# Patient Record
Sex: Female | Born: 1995 | Race: White | Hispanic: No | Marital: Married | Smoking: Former smoker
Health system: Southern US, Community
[De-identification: ages and names within clinical notes are randomized; demographics above are authoritative.]

## PROBLEM LIST (undated history)

## (undated) DIAGNOSIS — K3184 Gastroparesis: Secondary | ICD-10-CM

## (undated) HISTORY — PX: CHOLECYSTECTOMY: SHX55

## (undated) HISTORY — PX: GASTROSTOMY TUBE PLACEMENT: SHX655

---
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
## 2015-03-03 ENCOUNTER — Emergency Department (HOSPITAL_BASED_OUTPATIENT_CLINIC_OR_DEPARTMENT_OTHER): Payer: Medicaid Other

## 2015-03-03 ENCOUNTER — Encounter (HOSPITAL_BASED_OUTPATIENT_CLINIC_OR_DEPARTMENT_OTHER): Payer: Self-pay | Admitting: *Deleted

## 2015-03-03 ENCOUNTER — Emergency Department (HOSPITAL_BASED_OUTPATIENT_CLINIC_OR_DEPARTMENT_OTHER)
Admission: EM | Admit: 2015-03-03 | Discharge: 2015-03-03 | Disposition: A | Discharge status: Home / Self Care | Payer: Medicaid Other | Attending: Emergency Medicine | Admitting: Emergency Medicine

## 2015-03-03 DIAGNOSIS — J069 Acute upper respiratory infection, unspecified: Secondary | ICD-10-CM | POA: Diagnosis not present

## 2015-03-03 DIAGNOSIS — R000 Tachycardia, unspecified: Secondary | ICD-10-CM | POA: Diagnosis not present

## 2015-03-03 DIAGNOSIS — E669 Obesity, unspecified: Secondary | ICD-10-CM | POA: Diagnosis not present

## 2015-03-03 DIAGNOSIS — R05 Cough: Secondary | ICD-10-CM | POA: Diagnosis present

## 2015-03-03 LAB — CBC WITH DIFFERENTIAL/PLATELET
BASOS ABS: 0 10*3/uL (ref 0.0–0.1)
BASOS PCT: 0 %
EOS ABS: 0.1 10*3/uL (ref 0.0–0.7)
EOS PCT: 1 %
HCT: 42.9 % (ref 36.0–46.0)
Hemoglobin: 13.9 g/dL (ref 12.0–15.0)
Lymphocytes Relative: 13 %
Lymphs Abs: 1.1 10*3/uL (ref 0.7–4.0)
MCH: 29.1 pg (ref 26.0–34.0)
MCHC: 32.4 g/dL (ref 30.0–36.0)
MCV: 89.9 fL (ref 78.0–100.0)
MONO ABS: 0.5 10*3/uL (ref 0.1–1.0)
Monocytes Relative: 6 %
Neutro Abs: 6.6 10*3/uL (ref 1.7–7.7)
Neutrophils Relative %: 80 %
PLATELETS: 215 10*3/uL (ref 150–400)
RBC: 4.77 MIL/uL (ref 3.87–5.11)
RDW: 13 % (ref 11.5–15.5)
WBC: 8.3 10*3/uL (ref 4.0–10.5)

## 2015-03-03 LAB — COMPREHENSIVE METABOLIC PANEL
ALBUMIN: 3.9 g/dL (ref 3.5–5.0)
ALK PHOS: 83 U/L (ref 38–126)
ALT: 44 U/L (ref 14–54)
ANION GAP: 6 (ref 5–15)
AST: 32 U/L (ref 15–41)
BILIRUBIN TOTAL: 0.9 mg/dL (ref 0.3–1.2)
BUN: 12 mg/dL (ref 6–20)
CALCIUM: 9 mg/dL (ref 8.9–10.3)
CO2: 26 mmol/L (ref 22–32)
Chloride: 107 mmol/L (ref 101–111)
Creatinine, Ser: 0.94 mg/dL (ref 0.44–1.00)
GFR calc Af Amer: >60 mL/min (ref >60)
GLUCOSE: 122 mg/dL — AB (ref 65–99)
Potassium: 3.6 mmol/L (ref 3.5–5.1)
Sodium: 139 mmol/L (ref 135–145)
TOTAL PROTEIN: 7.1 g/dL (ref 6.5–8.1)

## 2015-03-03 MED ORDER — ACETAMINOPHEN 325 MG PO TABS: 650.0000 mg | ORAL_TABLET | Freq: Once | ORAL | Status: DC

## 2015-03-03 MED ORDER — SODIUM CHLORIDE 0.9 % IV BOLUS (SEPSIS)
2000.0000 mL | Freq: Once | INTRAVENOUS | Status: AC
  Administered 2015-03-03: 1000 mL via INTRAVENOUS

## 2015-03-03 NOTE — ED Provider Notes (Signed)
CSN: [id number]     Arrival date & time 03/03/15  0901 History   First MD Initiated Contact with Patient 03/03/15 [phone]     Chief Complaint  Patient presents with  . URI     (Consider location/radiation/quality/duration/timing/severity/associated sxs/prior Treatment) HPI  19 year old female who is about 5 months pregnant presents with cough, congestion, sore throat, and chest congestion for the past 1 week. Has been having on and off fevers, last night her temperature was 101. Denies shortness of breath. Occasionally chest pain with coughing. Has been having muscle aches in her neck but no stiffness. No diffuse body aches. Went to an urgent care last week and was told to take TheraFlu. Has been taking that with no relief at all of her symptoms seem to be worsening. No abdominal pain. Vomited once last week but otherwise no vomiting or diarrhea. No urinary symptoms.  Past Medical History  Diagnosis Date  . Gastroparesis    Past Surgical History  Procedure Laterality Date  . Gastrostomy tube placement      has been removed  . Cholecystectomy     History reviewed. No pertinent family history. Social History  Substance Use Topics  . Smoking status: Never Smoker   . Smokeless tobacco: None  . Alcohol Use: No   OB History    Gravida Para Term Preterm AB TAB SAB Ectopic Multiple Living   1              Review of Systems  Constitutional: Positive for fever.  HENT: Positive for congestion and sore throat.   Respiratory: Positive for cough. Negative for shortness of breath.   Gastrointestinal: Negative for abdominal pain.  Genitourinary: Negative for dysuria.  All other systems reviewed and are negative.     Allergies  Hydrocodone-acetaminophen and Sulfa antibiotics  Home Medications   Prior to Admission medications   Not on File   BP 133/105 mmHg  Pulse 142  Temp(Src) 97.6 F (36.4 C) (Oral)  Resp 20  Ht 5\' 8"  (1.727 m)  Wt 268 lb (121.564 kg)  BMI 40.76 kg/m2   SpO2 100% Physical Exam  Constitutional: She is oriented to person, place, and time. She appears well-developed and well-nourished.  obese  HENT:  Head: Normocephalic and atraumatic.  Right Ear: External ear normal.  Left Ear: External ear normal.  Nose: Nose normal.  Mouth/Throat: Uvula is midline. No oropharyngeal exudate.  Nasal congestion  Eyes: Right eye exhibits no discharge. Left eye exhibits no discharge.  Cardiovascular: Regular rhythm and normal heart sounds.  Tachycardia present.   HR in 120s  Pulmonary/Chest: Effort normal and breath sounds normal. She has no rales.  Abdominal: Soft. There is no tenderness.  Lymphadenopathy:    She has no cervical adenopathy.  Neurological: She is alert and oriented to person, place, and time.  Skin: Skin is warm and dry.  Nursing note and vitals reviewed.   ED Course  Procedures (including critical care time) Labs Review Labs Reviewed  COMPREHENSIVE METABOLIC PANEL - Abnormal; Notable for the following:    Glucose, Bld 122 (*)    All other components within normal limits  CBC WITH DIFFERENTIAL/PLATELET    Imaging Review Dg Chest 2 View  03/03/2015  CLINICAL DATA:  Productive cough and fever. EXAM: CHEST  2 VIEW COMPARISON:  09/28/2014 FINDINGS: The heart size and mediastinal contours are within normal limits. Both lungs are clear. The visualized skeletal structures are unremarkable. IMPRESSION: Normal chest. Electronically Signed   By: Francene BoyersJames  Maxwell  M.D.   On: 03/03/2015 10:03   I have personally reviewed and evaluated these images and lab results as part of my medical decision-making.   EKG Interpretation   Date/Time:  Saturday March 03 2015 09:12:30 EST Ventricular Rate:  141 PR Interval:  104 QRS Duration: 82 QT Interval:  366 QTC Calculation: 560 R Axis:   98 Text Interpretation:  Sinus tachycardia with short PR Rightward axis  Nonspecific ST and T wave abnormality Abnormal ECG No old tracing to  compare  Confirmed by Yasira Engelson  MD, Derrion Tritz ([id number]) on 03/03/2015 9:14:08 AM      MDM   Final diagnoses:  Upper respiratory infection    Patient's tachycardia is at least partially due to the TheraFlu she took just prior to arrival. Discussed x-ray imaging in the setting of pregnancy and patient agrees. X-ray shows no pneumonia. Her sore throat is likely a part of her overall illness, very low suspicion that this is strep pharyngitis, especially given her Centor score is 0. Heart rate is now normal after IV fluids. This appears to be a viral upper respiratory infection. No indication for antibiotics. Discussed treatment options given her current pregnancy and recommend consultation with pharmacist for further options    Pricilla Loveless, MD 03/03/15 1627

## 2015-03-03 NOTE — ED Notes (Signed)
Pt reports URI x 1 week. Pt took theraflu this morning.  Pt seen and treated by PCP last week.

## 2016-02-12 ENCOUNTER — Encounter (HOSPITAL_BASED_OUTPATIENT_CLINIC_OR_DEPARTMENT_OTHER): Payer: Self-pay

## 2016-02-12 ENCOUNTER — Emergency Department (HOSPITAL_BASED_OUTPATIENT_CLINIC_OR_DEPARTMENT_OTHER)
Admission: EM | Admit: 2016-02-12 | Discharge: 2016-02-12 | Disposition: A | Discharge status: Home / Self Care | Payer: Medicaid Other | Attending: Dermatology | Admitting: Dermatology

## 2016-02-12 DIAGNOSIS — F17200 Nicotine dependence, unspecified, uncomplicated: Secondary | ICD-10-CM | POA: Insufficient documentation

## 2016-02-12 DIAGNOSIS — Y999 Unspecified external cause status: Secondary | ICD-10-CM | POA: Diagnosis not present

## 2016-02-12 DIAGNOSIS — S0990XA Unspecified injury of head, initial encounter: Secondary | ICD-10-CM | POA: Insufficient documentation

## 2016-02-12 DIAGNOSIS — Y939 Activity, unspecified: Secondary | ICD-10-CM | POA: Insufficient documentation

## 2016-02-12 DIAGNOSIS — Z5321 Procedure and treatment not carried out due to patient leaving prior to being seen by health care provider: Secondary | ICD-10-CM | POA: Insufficient documentation

## 2016-02-12 DIAGNOSIS — W2203XA Walked into furniture, initial encounter: Secondary | ICD-10-CM | POA: Diagnosis not present

## 2016-02-12 DIAGNOSIS — Y929 Unspecified place or not applicable: Secondary | ICD-10-CM | POA: Insufficient documentation

## 2016-02-12 NOTE — ED Triage Notes (Signed)
Pt states she struck head on bunk bed approx 9am-no LOC-no break in skin noted-NAD-steady gait

## 2016-02-12 NOTE — ED Notes (Signed)
Pt went outside to smoke.  Saw pt get into her car and leave.

## 2016-03-14 ENCOUNTER — Encounter (HOSPITAL_BASED_OUTPATIENT_CLINIC_OR_DEPARTMENT_OTHER): Payer: Self-pay | Admitting: Emergency Medicine

## 2016-03-14 ENCOUNTER — Emergency Department (HOSPITAL_BASED_OUTPATIENT_CLINIC_OR_DEPARTMENT_OTHER): Payer: Medicaid Other

## 2016-03-14 ENCOUNTER — Emergency Department (HOSPITAL_BASED_OUTPATIENT_CLINIC_OR_DEPARTMENT_OTHER)
Admission: EM | Admit: 2016-03-14 | Discharge: 2016-03-14 | Disposition: A | Discharge status: Home / Self Care | Payer: Medicaid Other | Attending: Emergency Medicine | Admitting: Emergency Medicine

## 2016-03-14 DIAGNOSIS — R05 Cough: Secondary | ICD-10-CM | POA: Insufficient documentation

## 2016-03-14 DIAGNOSIS — J069 Acute upper respiratory infection, unspecified: Secondary | ICD-10-CM | POA: Insufficient documentation

## 2016-03-14 DIAGNOSIS — F17200 Nicotine dependence, unspecified, uncomplicated: Secondary | ICD-10-CM | POA: Insufficient documentation

## 2016-03-14 DIAGNOSIS — J029 Acute pharyngitis, unspecified: Secondary | ICD-10-CM

## 2016-03-14 DIAGNOSIS — Z720 Tobacco use: Secondary | ICD-10-CM

## 2016-03-14 DIAGNOSIS — R059 Cough, unspecified: Secondary | ICD-10-CM

## 2016-03-14 LAB — RAPID STREP SCREEN (MED CTR MEBANE ONLY): STREPTOCOCCUS, GROUP A SCREEN (DIRECT): NEGATIVE

## 2016-03-14 MED ORDER — ALBUTEROL SULFATE (2.5 MG/3ML) 0.083% IN NEBU
5.0000 mg | INHALATION_SOLUTION | Freq: Once | RESPIRATORY_TRACT | Status: AC
  Administered 2016-03-14: 5 mg via RESPIRATORY_TRACT
  Filled 2016-03-14: qty 6

## 2016-03-14 MED ORDER — ALBUTEROL SULFATE HFA 108 (90 BASE) MCG/ACT IN AERS: 2.0000 | INHALATION_SPRAY | RESPIRATORY_TRACT | 0 refills | Status: AC | PRN

## 2016-03-14 MED ORDER — ALBUTEROL SULFATE HFA 108 (90 BASE) MCG/ACT IN AERS
2.0000 | INHALATION_SPRAY | Freq: Once | RESPIRATORY_TRACT | Status: AC
  Administered 2016-03-14: 2 via RESPIRATORY_TRACT
  Filled 2016-03-14: qty 6.7

## 2016-03-14 MED ORDER — IPRATROPIUM BROMIDE 0.02 % IN SOLN
0.5000 mg | Freq: Once | RESPIRATORY_TRACT | Status: AC
  Administered 2016-03-14: 0.5 mg via RESPIRATORY_TRACT
  Filled 2016-03-14: qty 2.5

## 2016-03-14 MED ORDER — AZITHROMYCIN 250 MG PO TABS: ORAL_TABLET | ORAL | 0 refills | Status: DC

## 2016-03-14 NOTE — ED Provider Notes (Signed)
[facility]   CSN: [id number] Arrival date & time: 03/14/16  1008     History   Chief Complaint Chief Complaint  Patient presents with  . Cough    HPI Veronica Mejia is a 21 y.o. female with a PMHx of gastroparesis and PSHx of cholecystectomy, who presents to the ED with complaints of 2 weeks of cough with green sputum production and 4 days of sore throat and fever with Tmax 101.0 this morning. She took Tylenol at 6 AM which helped reduce her fever, but she has been trying Robitussin and Mucinex without relief of her other symptoms. She also endorses right otalgia as well as sinus congestion. No known aggravating factors. She admits to being a cigarette smoker. Multiple positive sick contacts at home. She denies getting flu shot this year.  She denies ear drainage, drooling, trismus, rhinorrhea, hemoptysis, wheezing, CP, SOB, abd pain, N/V/D/C, hematuria, dysuria, myalgias, arthralgias, numbness, tingling, weakness, or any other complaints at this time.    The history is provided by the patient and medical records. No language interpreter was used.  Cough  This is a new problem. The current episode started more than 1 week ago. The problem occurs constantly. The problem has not changed since onset.The cough is productive of sputum. The maximum temperature recorded prior to her arrival was 101 to 101.9 F. The fever has been present for 3 to 4 days. Associated symptoms include ear pain and sore throat. Pertinent negatives include no chest pain, no rhinorrhea, no myalgias, no shortness of breath and no wheezing. She has tried decongestants for the symptoms. The treatment provided no relief. She is a smoker.    Past Medical History:  Diagnosis Date  . Gastroparesis     There are no active problems to display for this patient.   Past Surgical History:  Procedure Laterality Date  . CHOLECYSTECTOMY    . GASTROSTOMY TUBE PLACEMENT     has been removed    OB  History    Gravida Para Term Preterm AB Living   1             SAB TAB Ectopic Multiple Live Births                   Home Medications    Prior to Admission medications   Not on File    Family History No family history on file.  Social History Social History  Substance Use Topics  . Smoking status: Current Every Day Smoker  . Smokeless tobacco: Never Used  . Alcohol use No     Allergies   Hydrocodone-acetaminophen and Sulfa antibiotics   Review of Systems Review of Systems  Constitutional: Positive for fever.  HENT: Positive for congestion, ear pain and sore throat. Negative for drooling, ear discharge, rhinorrhea and trouble swallowing.   Respiratory: Positive for cough. Negative for shortness of breath and wheezing.   Cardiovascular: Negative for chest pain.  Gastrointestinal: Negative for abdominal pain, constipation, diarrhea, nausea and vomiting.  Genitourinary: Negative for dysuria and hematuria.  Musculoskeletal: Negative for arthralgias and myalgias.  Skin: Negative for color change.  Allergic/Immunologic: Negative for immunocompromised state.  Neurological: Negative for weakness and numbness.  Psychiatric/Behavioral: Negative for confusion.   10 Systems reviewed and are negative for acute change except as noted in the HPI.   Physical Exam Updated Vital Signs BP 158/71 (BP Location: Right Arm)   Pulse 90   Temp 98.3 F (36.8 C) (Oral)  Resp 20   Ht 5\' 7"  (1.702 m)   Wt 88.5 kg   LMP 03/04/2016   SpO2 100%   BMI 30.54 kg/m   Physical Exam  Constitutional: She is oriented to person, place, and time. Vital signs are normal. She appears well-developed and well-nourished.  Non-toxic appearance. No distress.  Afebrile, nontoxic, NAD  HENT:  Head: Normocephalic and atraumatic.  Right Ear: Hearing, tympanic membrane, external ear and ear canal normal.  Left Ear: Hearing, tympanic membrane, external ear and ear canal normal.  Nose: Mucosal edema and  rhinorrhea present.  Mouth/Throat: Uvula is midline, oropharynx is clear and moist and mucous membranes are normal. No trismus in the jaw. No uvula swelling. Tonsils are 0 on the right. Tonsils are 0 on the left. No tonsillar exudate.  Ears are clear bilaterally. Nose with mild mucosal edema and clear rhinorrhea. Oropharynx clear and moist, without uvular swelling or deviation, no trismus or drooling, no tonsillar swelling or erythema, no exudates.    Eyes: Conjunctivae and EOM are normal. Right eye exhibits no discharge. Left eye exhibits no discharge.  Neck: Normal range of motion. Neck supple.  Cardiovascular: Normal rate, regular rhythm, normal heart sounds and intact distal pulses.  Exam reveals no gallop and no friction rub.   No murmur heard. Pulmonary/Chest: Effort normal. No respiratory distress. She has decreased breath sounds. She has no wheezes. She has no rhonchi. She has no rales.  Body habitus slightly limits exam, but lung sounds possibly slightly diminished in all lung fields, otherwise seems CTAB in all lung fields, no w/r/r, no hypoxia or increased WOB, speaking in full sentences, SpO2 100% on RA   Abdominal: Soft. Normal appearance and bowel sounds are normal. She exhibits no distension. There is no tenderness. There is no rigidity, no rebound, no guarding, no CVA tenderness, no tenderness at McBurney's point and negative Murphy's sign.  Musculoskeletal: Normal range of motion.  Lymphadenopathy:    She has cervical adenopathy.  Shotty cervical LAD bilaterally  Neurological: She is alert and oriented to person, place, and time. She has normal strength. No sensory deficit.  Skin: Skin is warm, dry and intact. No rash noted.  Psychiatric: She has a normal mood and affect.  Nursing note and vitals reviewed.    ED Treatments / Results  Labs (all labs ordered are listed, but only abnormal results are displayed) Labs Reviewed  RAPID STREP SCREEN (NOT AT [facility])  CULTURE, GROUP A  STREP [facility])    EKG  EKG Interpretation None       Radiology Dg Chest 2 View  Result Date: 03/14/2016 CLINICAL DATA:  Cough and wheezing for 1 week. Low grade fever for 4 days. EXAM: CHEST  2 VIEW COMPARISON:  PA and lateral chest 11/02/2015. FINDINGS: Lungs clear. Heart size normal. No pneumothorax or pleural effusion. IMPRESSION: No acute disease. Electronically Signed   By: Drusilla Kanner M.D.   On: 03/14/2016 11:08    Procedures Procedures (including critical care time)  Medications Ordered in ED Medications  albuterol (PROVENTIL HFA;VENTOLIN HFA) 108 (90 Base) MCG/ACT inhaler 2 puff (not administered)  albuterol (PROVENTIL) (2.5 MG/3ML) 0.083% nebulizer solution 5 mg (5 mg Nebulization Given 03/14/16 1056)  ipratropium (ATROVENT) nebulizer solution 0.5 mg (0.5 mg Nebulization Given 03/14/16 1056)     Initial Impression / Assessment and Plan / ED Course  I have reviewed the triage vital signs and the nursing notes.  Pertinent labs & imaging results that were available during my care of the  patient were reviewed by me and considered in my medical decision making (see chart for details).  Clinical Course     21 y.o. female here with cough x2 wks, fever/sore throat x4 days. Lungs with slightly diminished lung sounds, throat clear, ears clear, afebrile and nontoxic appearing. RST obtained in triage which was neg. Will give duoneb and obtain CXR; this could possibly represent bacterial etiology vs 2 separate viral illnesses. Too late for any flu rx's to be beneficial so will hold off on swabbing for flu.   1:10 PM CXR neg. Lung sounds greatly improved after duoneb. Given duration of symptoms and sudden worsening several days ago, could be bacterial etiology therefore will send home with zpak to cover for potential bacterial illness; will also rx albuterol inhaler. Smoking cessation strongly encouraged. OTC meds for symptom control discussed. F/up with PCP in 1wk for recheck of  symptoms. I explained the diagnosis and have given explicit precautions to return to the ER including for any other new or worsening symptoms. The patient understands and accepts the medical plan as it's been dictated and I have answered their questions. Discharge instructions concerning home care and prescriptions have been given. The patient is STABLE and is discharged to home in good condition.   Final Clinical Impressions(s) / ED Diagnoses   Final diagnoses:  Cough  Upper respiratory tract infection, unspecified type  Tobacco user  Sore throat    New Prescriptions New Prescriptions   ALBUTEROL (PROVENTIL HFA;VENTOLIN HFA) 108 (90 BASE) MCG/ACT INHALER    Inhale 2 puffs into the lungs every 4 (four) hours as needed for wheezing or shortness of breath (cough).   AZITHROMYCIN (ZITHROMAX Z-PAK) 250 MG TABLET    2 po day one, then 1 daily x 4 days     [address] PA-C 03/14/16 1311    Laurence Spates, MD 03/18/16 [phone]

## 2016-03-14 NOTE — ED Triage Notes (Addendum)
Productive cough with green sputum, sore throat and fever x 1 week. Pt reports temp of 101 this morning, took tylenol at 6:00

## 2016-03-14 NOTE — Discharge Instructions (Signed)
Continue to stay well-hydrated. Gargle warm salt water and spit it out. Use chloraseptic spray as needed for sore throat. Continue to alternate between Tylenol and Ibuprofen for pain or fever. Use Mucinex for cough suppression/expectoration of mucus. Use netipot and flonase to help with nasal congestion. May consider over-the-counter Benadryl or other antihistamine to decrease secretions and for help with your symptoms. Use inhaler as directed, as needed for cough/chest congestion/wheezing/shortness of breath. Take antibiotic as directed until finished, but keep in mind that this illness may still be viral and will just take time to improve on its own. STOP SMOKING! Follow up with your primary care doctor in 5-7 days for recheck of ongoing symptoms. Return to emergency department for emergent changing or worsening of symptoms.

## 2016-03-16 LAB — CULTURE, GROUP A STREP (THRC)

## 2016-04-02 ENCOUNTER — Encounter (HOSPITAL_BASED_OUTPATIENT_CLINIC_OR_DEPARTMENT_OTHER): Payer: Self-pay

## 2016-04-02 ENCOUNTER — Emergency Department (HOSPITAL_BASED_OUTPATIENT_CLINIC_OR_DEPARTMENT_OTHER)
Admission: EM | Admit: 2016-04-02 | Discharge: 2016-04-02 | Disposition: A | Discharge status: Home / Self Care | Payer: Medicaid Other | Attending: Emergency Medicine | Admitting: Emergency Medicine

## 2016-04-02 DIAGNOSIS — F17200 Nicotine dependence, unspecified, uncomplicated: Secondary | ICD-10-CM | POA: Insufficient documentation

## 2016-04-02 DIAGNOSIS — J069 Acute upper respiratory infection, unspecified: Secondary | ICD-10-CM | POA: Diagnosis not present

## 2016-04-02 DIAGNOSIS — J029 Acute pharyngitis, unspecified: Secondary | ICD-10-CM | POA: Diagnosis present

## 2016-04-02 LAB — RAPID STREP SCREEN (MED CTR MEBANE ONLY): Streptococcus, Group A Screen (Direct): NEGATIVE

## 2016-04-02 MED ORDER — NAPROXEN 500 MG PO TABS: 500.0000 mg | ORAL_TABLET | Freq: Two times a day (BID) | ORAL | 0 refills | Status: DC

## 2016-04-02 NOTE — ED Triage Notes (Signed)
Cough, fever, sore throat x 2 days. Pt's family member dx with strep throat.

## 2016-04-02 NOTE — Discharge Instructions (Signed)
Please read and follow all provided instructions.  Your diagnoses today include:  1. Upper respiratory tract infection, unspecified type     You appear to have an upper respiratory infection (URI). An upper respiratory tract infection, or cold, is a viral infection of the air passages leading to the lungs. It should improve gradually after 5-7 days. You may have a lingering cough that lasts for 2- 4 weeks after the infection.  Tests performed today include:  Vital signs. See below for your results today.   Medications prescribed:   Naproxen - anti-inflammatory pain medication  Do not exceed 500mg  naproxen every 12 hours, take with food  You have been prescribed an anti-inflammatory medication or NSAID. Take with food. Take smallest effective dose for the shortest duration needed for your pain. Stop taking if you experience stomach pain or vomiting.  Take any prescribed medications only as directed. Treatment for your infection is aimed at treating the symptoms. There are no medications, such as antibiotics, that will cure your infection.   Home care instructions:  Follow any educational materials contained in this packet.   Your illness is contagious and can be spread to others, especially during the first 3 or 4 days. It cannot be cured by antibiotics or other medicines. Take basic precautions such as washing your hands often, covering your mouth when you cough or sneeze, and avoiding public places where you could spread your illness to others.   Please continue drinking plenty of fluids.  Use over-the-counter medicines as needed as directed on packaging for symptom relief.  You may also use ibuprofen or tylenol as directed on packaging for pain or fever.  Do not take multiple medicines containing Tylenol or acetaminophen to avoid taking too much of this medication.  Follow-up instructions: Please follow-up with your primary care provider in the next 3 days for further evaluation of  your symptoms if you are not feeling better.   Return instructions:   Please return to the Emergency Department if you experience worsening symptoms.   RETURN IMMEDIATELY IF you develop shortness of breath, confusion or altered mental status, a new rash, become dizzy, faint, or poorly responsive, or are unable to be cared for at home.  Please return if you have persistent vomiting and cannot keep down fluids or develop a fever that is not controlled by tylenol or motrin.    Please return if you have any other emergent concerns.  Additional Information:  Your vital signs today were: BP 129/83 (BP Location: Right Arm)    Pulse 98    Temp 98.6 F (37 C) (Oral)    Resp 18    LMP 03/10/2016    SpO2 100%  If your blood pressure (BP) was elevated above 135/85 this visit, please have this repeated by your doctor within one month. --------------

## 2016-04-02 NOTE — ED Provider Notes (Signed)
[facility]   CSN: [id number] Arrival date & time: 04/02/16  1731  By signing my name below, I, Alyssa GroveMartin Green, attest that this documentation has been prepared under the direction and in the presence of Renne CriglerJoshua Traycen Goyer, PA-C. Electronically Signed: Alyssa GroveMartin Green, ED Scribe. 04/02/16. 7:20 PM.   History   Chief Complaint Chief Complaint  Patient presents with  . Sore Throat   The history is provided by the patient. No language interpreter was used.    HPI Comments: Veronica Mejia is a 21 y.o. female who presents to the Emergency Department complaining of sudden onset, constant, moderate sore throat for 2 days. Pt reports associated cough, nasal congestion and fever. She has taken Tylenol with temporary relief to fever, but not to other symptoms. Her cousin was recently diagnosed with strep throat. She denies generalized body aches, abdominal pain, diarrhea.    Past Medical History:  Diagnosis Date  . Gastroparesis     There are no active problems to display for this patient.   Past Surgical History:  Procedure Laterality Date  . CHOLECYSTECTOMY    . GASTROSTOMY TUBE PLACEMENT     has been removed    OB History    Gravida Para Term Preterm AB Living   1             SAB TAB Ectopic Multiple Live Births                   Home Medications    Prior to Admission medications   Medication Sig Start Date End Date Taking? Authorizing Provider  albuterol (PROVENTIL HFA;VENTOLIN HFA) 108 (90 Base) MCG/ACT inhaler Inhale 2 puffs into the lungs every 4 (four) hours as needed for wheezing or shortness of breath (cough). 03/14/16   [address], PA-C  azithromycin (ZITHROMAX Z-PAK) 250 MG tablet 2 po day one, then 1 daily x 4 days 03/14/16   [address], PA-C    Family History No family history on file.  Social History Social History  Substance Use Topics  . Smoking status: Current Every Day Smoker    Packs/day: 0.50  . Smokeless tobacco: Never  Used  . Alcohol use Yes     Comment: 1 drink per day     Allergies   Hydrocodone-acetaminophen and Sulfa antibiotics   Review of Systems Review of Systems  Constitutional: Positive for fever. Negative for chills and fatigue.  HENT: Positive for congestion and sore throat. Negative for ear pain, rhinorrhea and sinus pressure.   Eyes: Negative for redness.  Respiratory: Positive for cough. Negative for wheezing.   Gastrointestinal: Negative for abdominal pain, diarrhea, nausea and vomiting.  Genitourinary: Negative for dysuria.  Musculoskeletal: Negative for myalgias and neck stiffness.       No generalized body aches  Skin: Negative for rash.  Neurological: Negative for headaches.  Hematological: Negative for adenopathy.   Physical Exam Updated Vital Signs BP 129/83 (BP Location: Right Arm)   Pulse 98   Temp 98.6 F (37 C) (Oral)   Resp 18   LMP 03/10/2016   SpO2 100%   Physical Exam  Constitutional: She appears well-developed and well-nourished.  HENT:  Head: Normocephalic and atraumatic.  Right Ear: Tympanic membrane, external ear and ear canal normal.  Left Ear: Tympanic membrane, external ear and ear canal normal.  Nose: Nose normal. No mucosal edema or rhinorrhea.  Mouth/Throat: Uvula is midline and mucous membranes are normal. Mucous membranes are not dry. No oral lesions. No trismus  in the jaw. No uvula swelling. Posterior oropharyngeal erythema present. No oropharyngeal exudate, posterior oropharyngeal edema or tonsillar abscesses.  Eyes: Conjunctivae are normal. Right eye exhibits no discharge. Left eye exhibits no discharge.  Neck: Normal range of motion. Neck supple.  Cardiovascular: Normal rate, regular rhythm and normal heart sounds.   Pulmonary/Chest: Effort normal and breath sounds normal. No respiratory distress. She has no wheezes. She has no rales.  Abdominal: Soft. There is no tenderness.  Lymphadenopathy:    She has no cervical adenopathy.    Neurological: She is alert.  Skin: Skin is warm and dry.  Psychiatric: She has a normal mood and affect.  Nursing note and vitals reviewed.    ED Treatments / Results  DIAGNOSTIC STUDIES: Oxygen Saturation is 100% on RA, normal by my interpretation.    COORDINATION OF CARE: 7:20 PM Discussed treatment plan with pt at bedside which includes Strep culture and pt agreed to plan.  Labs (all labs ordered are listed, but only abnormal results are displayed) Labs Reviewed  RAPID STREP SCREEN (NOT AT [facility])  CULTURE, GROUP A STREP [facility])    Procedures Procedures (including critical care time)  Medications Ordered in ED Medications - No data to display   Initial Impression / Assessment and Plan / ED Course  I have reviewed the triage vital signs and the nursing notes.  Pertinent labs & imaging results that were available during my care of the patient were reviewed by me and considered in my medical decision making (see chart for details).     Vital signs reviewed and are as follows: Vitals:   04/02/16 1736  BP: 129/83  Pulse: 98  Resp: 18  Temp: 98.6 F (37 C)    7:50 PM Informed of neg strep results. Patient counseled on supportive care for viral URI and s/s to return including worsening symptoms, persistent fever, persistent vomiting, or if they have any other concerns. Urged to see PCP if symptoms persist for more than 3 days. Patient verbalizes understanding and agrees with plan.    Final Clinical Impressions(s) / ED Diagnoses   Final diagnoses:  Upper respiratory tract infection, unspecified type   Patient with symptoms consistent with a viral syndrome. Strep neg. Vitals are stable, no fever. No signs of dehydration. Lung exam normal, no signs of pneumonia. Supportive therapy indicated with return if symptoms worsen.     New Prescriptions New Prescriptions   NAPROXEN (NAPROSYN) 500 MG TABLET    Take 1 tablet (500 mg total) by mouth 2 (two) times daily.      Renne Crigler, PA-C 04/02/16 1950    Benjiman Core, MD 04/02/16 [phone]

## 2016-04-05 LAB — CULTURE, GROUP A STREP (THRC)

## 2017-04-29 ENCOUNTER — Emergency Department (HOSPITAL_BASED_OUTPATIENT_CLINIC_OR_DEPARTMENT_OTHER)
Admission: EM | Admit: 2017-04-29 | Discharge: 2017-04-29 | Disposition: A | Discharge status: Home / Self Care | Payer: Self-pay | Attending: Emergency Medicine | Admitting: Emergency Medicine

## 2017-04-29 ENCOUNTER — Other Ambulatory Visit: Payer: Self-pay

## 2017-04-29 ENCOUNTER — Encounter (HOSPITAL_BASED_OUTPATIENT_CLINIC_OR_DEPARTMENT_OTHER): Payer: Self-pay | Admitting: *Deleted

## 2017-04-29 DIAGNOSIS — M67432 Ganglion, left wrist: Secondary | ICD-10-CM | POA: Insufficient documentation

## 2017-04-29 DIAGNOSIS — F17200 Nicotine dependence, unspecified, uncomplicated: Secondary | ICD-10-CM | POA: Insufficient documentation

## 2017-04-29 DIAGNOSIS — Z9049 Acquired absence of other specified parts of digestive tract: Secondary | ICD-10-CM | POA: Insufficient documentation

## 2017-04-29 DIAGNOSIS — Z79899 Other long term (current) drug therapy: Secondary | ICD-10-CM | POA: Insufficient documentation

## 2017-04-29 MED ORDER — LIDOCAINE-EPINEPHRINE (PF) 2 %-1:200000 IJ SOLN
10.0000 mL | Freq: Once | INTRAMUSCULAR | Status: AC
  Administered 2017-04-29: 10 mL via INTRADERMAL
  Filled 2017-04-29: qty 10

## 2017-04-29 NOTE — Discharge Instructions (Signed)
Keep wound clean and dry. Bacitracin ointment twice a day. Keep compression dressing for the next few days. Follow up with family doctor as needed.

## 2017-04-29 NOTE — ED Provider Notes (Signed)
[facility] EMERGENCY DEPARTMENT Provider Note   CSN: [id number] Arrival date & time: 04/29/17  1041     History   Chief Complaint Chief Complaint  Patient presents with  . Wrist Pain    HPI Veronica Mejia is a 22 y.o. female.  HPI Veronica Mejia is a 22 y.o. female sent to emergency department complaining of wrist swelling.  Patient states she has had a cyst on her right wrist for 4 years.  She states the last several weeks it has gotten slightly larger and more painful.  She denies any numbness or tingling distal to the cyst.  She has full range of motion of all fingers and normal use of her hand.  He states pain is worsened with any movement at the wrist joint.  Denies any fever or chills.  She denies any redness of skin discoloration.  She has no other complaints.  She does not have a primary care doctor or insurance so unable to follow-up.  Past Medical History:  Diagnosis Date  . Gastroparesis     There are no active problems to display for this patient.   Past Surgical History:  Procedure Laterality Date  . CHOLECYSTECTOMY    . GASTROSTOMY TUBE PLACEMENT     has been removed    OB History    Gravida Para Term Preterm AB Living   1             SAB TAB Ectopic Multiple Live Births                   Home Medications    Prior to Admission medications   Medication Sig Start Date End Date Taking? Authorizing Provider  albuterol (PROVENTIL HFA;VENTOLIN HFA) 108 (90 Base) MCG/ACT inhaler Inhale 2 puffs into the lungs every 4 (four) hours as needed for wheezing or shortness of breath (cough). 03/14/16   Street, WayneMercedes, PA-C  naproxen (NAPROSYN) 500 MG tablet Take 1 tablet (500 mg total) by mouth 2 (two) times daily. 04/02/16   Renne CriglerGeiple, Joshua, PA-C    Family History History reviewed. No pertinent family history.  Social History Social History   Tobacco Use  . Smoking status: Current Every Day Smoker    Packs/day: 0.50  . Smokeless tobacco: Never Used    Substance Use Topics  . Alcohol use: Yes    Comment: 1 drink per day  . Drug use: No     Allergies   Hydrocodone-acetaminophen and Sulfa antibiotics   Review of Systems Review of Systems  Constitutional: Negative for chills and fever.  Respiratory: Negative for cough, chest tightness and shortness of breath.   Cardiovascular: Negative for chest pain, palpitations and leg swelling.  Musculoskeletal: Positive for arthralgias and joint swelling. Negative for myalgias, neck pain and neck stiffness.  Skin: Negative for rash.  Neurological: Negative for weakness and numbness.  All other systems reviewed and are negative.    Physical Exam Updated Vital Signs BP 118/70 (BP Location: Right Arm)   Pulse 80   Temp 97.6 F (36.4 C) (Oral)   Ht 5\' 7"  (1.702 m)   Wt 127 kg (280 lb)   LMP 03/17/2017 (Exact Date)   SpO2 100%   Breastfeeding? No   BMI 43.85 kg/m   Physical Exam  Constitutional: She appears well-developed and well-nourished. No distress.  Eyes: Conjunctivae are normal.  Neck: Neck supple.  Musculoskeletal:  Approximately 3 x 3 cm cystic structure to the anterior left wrist, closer to the radial aspect  of the wrist.  It is soft, fluctuant.  No skin discoloration.  Minimal tenderness with palpation. Normal hand with full ROM of all fingers, strength of all finger, and sensation distally  Neurological: She is alert.  Skin: Skin is warm and dry.  Nursing note and vitals reviewed.    ED Treatments / Results  Labs (all labs ordered are listed, but only abnormal results are displayed) Labs Reviewed - No data to display  EKG  EKG Interpretation None       Radiology No results found.  Procedures .Marland KitchenIncision and Drainage Date/Time: 04/29/2017 12:27 PM Performed by: Jaynie Crumble, PA-C Authorized by: Jaynie Crumble, PA-C   Consent:    Consent obtained:  Verbal   Consent given by:  Patient   Risks discussed:  Bleeding and damage to other organs    Alternatives discussed:  No treatment and alternative treatment Location:    Type:  Ganglion cyst   Size:  3cm   Location:  Upper extremity   Upper extremity location:  Wrist   Wrist location:  L wrist Pre-procedure details:    Skin preparation:  Betadine Anesthesia (see MAR for exact dosages):    Anesthesia method:  Local infiltration   Local anesthetic:  Lidocaine 2% WITH epi Procedure type:    Complexity:  Simple Procedure details:    Incision types:  Stab incision   Incision depth:  Dermal   Scalpel blade:  11   Drainage:  Serous   Drainage amount:  Copious   Wound treatment:  Wound left open   Packing materials:  None Post-procedure details:    Patient tolerance of procedure:  Tolerated well, no immediate complications   (including critical care time)  Medications Ordered in ED Medications  lidocaine-EPINEPHrine (XYLOCAINE W/EPI) 2 %-1:200000 (PF) injection 10 mL (not administered)     Initial Impression / Assessment and Plan / ED Course  I have reviewed the triage vital signs and the nursing notes.  Pertinent labs & imaging results that were available during my care of the patient were reviewed by me and considered in my medical decision making (see chart for details).     Sent with a large cyst to the left wrist, limiting range of motion of the wrist and causing comfort.  No signs of infection.  She states she is unable to follow-up because she has no insurance.  Initially tried to aspirate the cyst, however the material incise the cyst is too thick, and once I pulled out the needle, clear generally like material started coming out of the site where the needle was inserted.  I used 11 blade to extend the cut just a little wider, and was able to express all of the material from the cyst.  Cyst was drained completely.  Pressure dressing applied.  I explained to the patient of the cyst may come back, and she will ultimately need to follow-up with a hand surgeon.  She  agreed  Vitals:   04/29/17 1048 04/29/17 1051  BP: 118/70   Pulse: 80   Temp: 97.6 F (36.4 C)   TempSrc: Oral   SpO2: 100%   Weight:  127 kg (280 lb)  Height:  5\' 7"  (1.702 m)     Final Clinical Impressions(s) / ED Diagnoses   Final diagnoses:  Ganglion cyst of volar aspect of left wrist    ED Discharge Orders    None       Jaynie Crumble, PA-C 04/29/17 1231    Doug Sou, MD  04/29/17 1641  

## 2017-04-29 NOTE — ED Notes (Signed)
NAD at this time. Pt is stable and going home,.  

## 2017-04-29 NOTE — ED Triage Notes (Signed)
Pt has had a knot on her wrist since age 22. For the last couple of days it has swollen really big and is painful and affecting her lifestyle.

## 2018-07-27 ENCOUNTER — Other Ambulatory Visit: Payer: Self-pay

## 2018-07-27 ENCOUNTER — Emergency Department (HOSPITAL_BASED_OUTPATIENT_CLINIC_OR_DEPARTMENT_OTHER)
Admission: EM | Admit: 2018-07-27 | Discharge: 2018-07-27 | Disposition: A | Discharge status: Home / Self Care | Payer: Medicaid - Out of State | Attending: Emergency Medicine | Admitting: Emergency Medicine

## 2018-07-27 ENCOUNTER — Encounter (HOSPITAL_BASED_OUTPATIENT_CLINIC_OR_DEPARTMENT_OTHER): Payer: Self-pay

## 2018-07-27 DIAGNOSIS — R8271 Bacteriuria: Secondary | ICD-10-CM | POA: Insufficient documentation

## 2018-07-27 DIAGNOSIS — R102 Pelvic and perineal pain: Secondary | ICD-10-CM | POA: Diagnosis not present

## 2018-07-27 DIAGNOSIS — Z87891 Personal history of nicotine dependence: Secondary | ICD-10-CM | POA: Insufficient documentation

## 2018-07-27 DIAGNOSIS — O219 Vomiting of pregnancy, unspecified: Secondary | ICD-10-CM

## 2018-07-27 DIAGNOSIS — Z79899 Other long term (current) drug therapy: Secondary | ICD-10-CM | POA: Insufficient documentation

## 2018-07-27 DIAGNOSIS — O9989 Other specified diseases and conditions complicating pregnancy, childbirth and the puerperium: Secondary | ICD-10-CM | POA: Diagnosis present

## 2018-07-27 LAB — URINALYSIS, ROUTINE W REFLEX MICROSCOPIC
Bilirubin Urine: NEGATIVE
Glucose, UA: NEGATIVE mg/dL
Ketones, ur: NEGATIVE mg/dL
Leukocytes,Ua: NEGATIVE
Nitrite: NEGATIVE
Protein, ur: NEGATIVE mg/dL
Specific Gravity, Urine: >1.03 — ABNORMAL HIGH (ref 1.005–1.030)
pH: 6 (ref 5.0–8.0)

## 2018-07-27 LAB — URINALYSIS, MICROSCOPIC (REFLEX)

## 2018-07-27 LAB — HCG, QUANTITATIVE, PREGNANCY: hCG, Beta Chain, Quant, S: 665 m[IU]/mL — ABNORMAL HIGH (ref <5)

## 2018-07-27 MED ORDER — CEPHALEXIN 500 MG PO CAPS: 500.0000 mg | ORAL_CAPSULE | Freq: Two times a day (BID) | ORAL | 0 refills | Status: DC

## 2018-07-27 MED ORDER — METOCLOPRAMIDE HCL 5 MG/ML IJ SOLN
10.0000 mg | Freq: Once | INTRAMUSCULAR | Status: AC
  Administered 2018-07-27: 20:00:00 10 mg via INTRAVENOUS
  Filled 2018-07-27: qty 2

## 2018-07-27 MED ORDER — SODIUM CHLORIDE 0.9 % IV BOLUS
1000.0000 mL | Freq: Once | INTRAVENOUS | Status: AC
  Administered 2018-07-27: 1000 mL via INTRAVENOUS

## 2018-07-27 MED ORDER — METOCLOPRAMIDE HCL 10 MG PO TABS: 10.0000 mg | ORAL_TABLET | Freq: Three times a day (TID) | ORAL | 0 refills | Status: DC | PRN

## 2018-07-27 NOTE — ED Notes (Signed)
Pt able to tolerate fluids and crackers, denies any nausea, vomiting or pain.

## 2018-07-27 NOTE — ED Provider Notes (Signed)
[facility] EMERGENCY DEPARTMENT Provider Note   CSN: [id number] Arrival date & time: 07/27/18  1929    History   Chief Complaint Chief Complaint  Patient presents with  . Emesis    HPI Veronica Mejia is a 23 y.o. female.     HPI Patient presents with nausea and vomiting for on Tuesdays.  States she has had 3+ home pregnancy tests.  Last menses was beginning of April.  No fevers.  Has dull abdominal pain.  No dysuria.  No vaginal bleeding or discharge.  Pain is dull.  Pain is mildly crampy in the lower abdomen. Past Medical History:  Diagnosis Date  . Gastroparesis     There are no active problems to display for this patient.   Past Surgical History:  Procedure Laterality Date  . CESAREAN SECTION    . CHOLECYSTECTOMY    . GASTROSTOMY TUBE PLACEMENT     has been removed     OB History    Gravida  1   Para      Term      Preterm      AB      Living        SAB      TAB      Ectopic      Multiple      Live Births               Home Medications    Prior to Admission medications   Medication Sig Start Date End Date Taking? Authorizing Provider  albuterol (PROVENTIL HFA;VENTOLIN HFA) 108 (90 Base) MCG/ACT inhaler Inhale 2 puffs into the lungs every 4 (four) hours as needed for wheezing or shortness of breath (cough). 03/14/16   Street, Mercedes, PA-C  cephALEXin (KEFLEX) 500 MG capsule Take 1 capsule (500 mg total) by mouth 2 (two) times daily. 07/27/18   Benjiman Core, MD  metoCLOPramide (REGLAN) 10 MG tablet Take 1 tablet (10 mg total) by mouth every 8 (eight) hours as needed for nausea. 07/27/18   Benjiman Core, MD  naproxen (NAPROSYN) 500 MG tablet Take 1 tablet (500 mg total) by mouth 2 (two) times daily. 04/02/16   Renne Crigler, PA-C    Family History No family history on file.  Social History Social History   Tobacco Use  . Smoking status: Former Games developer  . Smokeless tobacco: Never Used  Substance Use Topics  .  Alcohol use: Not Currently  . Drug use: No     Allergies   Hydrocodone-acetaminophen and Sulfa antibiotics   Review of Systems Review of Systems  Constitutional: Negative for appetite change, diaphoresis, fatigue and fever.  HENT: Negative for congestion.   Cardiovascular: Negative for chest pain.  Gastrointestinal: Positive for abdominal pain.  Genitourinary: Negative for vaginal bleeding and vaginal discharge.       Patient states her breasts are also sore.  Musculoskeletal: Negative for back pain.  Neurological: Negative for weakness.  Hematological: Negative for adenopathy.  Psychiatric/Behavioral: Negative for confusion.     Physical Exam Updated Vital Signs BP 130/75   Pulse (!) 102   Temp 98.2 F (36.8 C)   Resp 20   Ht 5\' 7"  (1.702 m)   Wt (!) 166.5 kg   LMP 06/16/2018   SpO2 100%   BMI 57.48 kg/m   Physical Exam Vitals signs and nursing note reviewed.  HENT:     Head: Normocephalic.  Cardiovascular:     Rate and Rhythm: Regular rhythm. Tachycardia present.  Abdominal:     Tenderness: There is no abdominal tenderness.     Comments: Patient is obese  Musculoskeletal:     Right lower leg: No edema.     Left lower leg: No edema.  Skin:    General: Skin is warm.     Capillary Refill: Capillary refill takes less than 2 seconds.  Neurological:     Mental Status: She is alert. Mental status is at baseline.      ED Treatments / Results  Labs (all labs ordered are listed, but only abnormal results are displayed) Labs Reviewed  URINALYSIS, ROUTINE W REFLEX MICROSCOPIC - Abnormal; Notable for the following components:      Result Value   Specific Gravity, Urine >1.030 (*)    Hgb urine dipstick TRACE (*)    All other components within normal limits  HCG, QUANTITATIVE, PREGNANCY - Abnormal; Notable for the following components:   hCG, Beta Chain, Quant, S 665 (*)    All other components within normal limits  URINALYSIS, MICROSCOPIC (REFLEX) -  Abnormal; Notable for the following components:   Bacteria, UA RARE (*)    All other components within normal limits    EKG None  Radiology No results found.  Procedures Procedures (including critical care time)  Medications Ordered in ED Medications  sodium chloride 0.9 % bolus 1,000 mL ( Intravenous Stopped 07/27/18 2107)  metoCLOPramide (REGLAN) injection 10 mg (10 mg Intravenous Given 07/27/18 2007)     Initial Impression / Assessment and Plan / ED Course  I have reviewed the triage vital signs and the nursing notes.  Pertinent labs & imaging results that were available during my care of the patient were reviewed by me and considered in my medical decision making (see chart for details).       Patient with nausea and vomiting.  Urine shows some bacteria with no clear infection.  Feels better after IV fluid.  Heart rate normalized.  Tolerated orals.  Will discharge home.  Will treat the bacteria however since she is pregnant.  Quantitative hCG only around 600.  Needs outpatient follow-up.  [facility].  Ectopic not entirely ruled out for felt less likely with minimal pain and crampiness that resolved with fluids.  Discharge home.  Final Clinical Impressions(s) / ED Diagnoses   Final diagnoses:  Nausea and vomiting during pregnancy  Bacteriuria during pregnancy in first trimester    ED Discharge Orders         Ordered    metoCLOPramide (REGLAN) 10 MG tablet  Every 8 hours PRN     07/27/18 2140    cephALEXin (KEFLEX) 500 MG capsule  2 times daily     07/27/18 2140           Benjiman CorePickering, Tyneisha Hegeman, MD 07/27/18 2143

## 2018-07-27 NOTE — ED Triage Notes (Signed)
Pt c/o n/v x 2 days-pos home preg test x 3-NAD-steady gait

## 2018-07-27 NOTE — ED Notes (Signed)
PO challenge started

## 2020-06-14 ENCOUNTER — Emergency Department (HOSPITAL_BASED_OUTPATIENT_CLINIC_OR_DEPARTMENT_OTHER)
Admission: EM | Admit: 2020-06-14 | Discharge: 2020-06-14 | Disposition: A | Discharge status: Home / Self Care | Payer: Medicaid Other | Attending: Emergency Medicine | Admitting: Emergency Medicine

## 2020-06-14 ENCOUNTER — Encounter (HOSPITAL_BASED_OUTPATIENT_CLINIC_OR_DEPARTMENT_OTHER): Payer: Self-pay | Admitting: *Deleted

## 2020-06-14 ENCOUNTER — Emergency Department (HOSPITAL_BASED_OUTPATIENT_CLINIC_OR_DEPARTMENT_OTHER): Payer: Medicaid Other

## 2020-06-14 ENCOUNTER — Emergency Department (HOSPITAL_COMMUNITY): Payer: Medicaid Other

## 2020-06-14 ENCOUNTER — Other Ambulatory Visit: Payer: Self-pay

## 2020-06-14 DIAGNOSIS — Z87891 Personal history of nicotine dependence: Secondary | ICD-10-CM | POA: Diagnosis not present

## 2020-06-14 DIAGNOSIS — Z3A08 8 weeks gestation of pregnancy: Secondary | ICD-10-CM | POA: Diagnosis not present

## 2020-06-14 DIAGNOSIS — O26891 Other specified pregnancy related conditions, first trimester: Secondary | ICD-10-CM | POA: Diagnosis not present

## 2020-06-14 DIAGNOSIS — Z3201 Encounter for pregnancy test, result positive: Secondary | ICD-10-CM

## 2020-06-14 DIAGNOSIS — O219 Vomiting of pregnancy, unspecified: Secondary | ICD-10-CM

## 2020-06-14 DIAGNOSIS — R1030 Lower abdominal pain, unspecified: Secondary | ICD-10-CM | POA: Diagnosis not present

## 2020-06-14 DIAGNOSIS — R109 Unspecified abdominal pain: Secondary | ICD-10-CM

## 2020-06-14 DIAGNOSIS — Z3A01 Less than 8 weeks gestation of pregnancy: Secondary | ICD-10-CM

## 2020-06-14 LAB — COMPREHENSIVE METABOLIC PANEL
ALT: 30 U/L (ref 0–44)
AST: 25 U/L (ref 15–41)
Albumin: 3.5 g/dL (ref 3.5–5.0)
Alkaline Phosphatase: 57 U/L (ref 38–126)
Anion gap: 9 (ref 5–15)
BUN: 7 mg/dL (ref 6–20)
CO2: 23 mmol/L (ref 22–32)
Calcium: 8.7 mg/dL — ABNORMAL LOW (ref 8.9–10.3)
Chloride: 104 mmol/L (ref 98–111)
Creatinine, Ser: 0.82 mg/dL (ref 0.44–1.00)
GFR, Estimated: >60 mL/min (ref >60)
Glucose, Bld: 102 mg/dL — ABNORMAL HIGH (ref 70–99)
Potassium: 3.7 mmol/L (ref 3.5–5.1)
Sodium: 136 mmol/L (ref 135–145)
Total Bilirubin: 0.5 mg/dL (ref 0.3–1.2)
Total Protein: 6.7 g/dL (ref 6.5–8.1)

## 2020-06-14 LAB — URINALYSIS, ROUTINE W REFLEX MICROSCOPIC
Glucose, UA: NEGATIVE mg/dL
Hgb urine dipstick: NEGATIVE
Ketones, ur: NEGATIVE mg/dL
Leukocytes,Ua: NEGATIVE
Nitrite: NEGATIVE
Protein, ur: NEGATIVE mg/dL
Specific Gravity, Urine: >1.03 — ABNORMAL HIGH (ref 1.005–1.030)
pH: 5.5 (ref 5.0–8.0)

## 2020-06-14 LAB — PREGNANCY, URINE: Preg Test, Ur: POSITIVE — AB

## 2020-06-14 MED ORDER — ONDANSETRON 4 MG PO TBDP
4.0000 mg | ORAL_TABLET | Freq: Once | ORAL | Status: AC
  Administered 2020-06-14: 4 mg via ORAL
  Filled 2020-06-14: qty 1

## 2020-06-14 MED ORDER — DOXYLAMINE-PYRIDOXINE 10-10 MG PO TBEC: 1.0000 | DELAYED_RELEASE_TABLET | Freq: Every day | ORAL | 0 refills | Status: DC

## 2020-06-14 NOTE — Discharge Instructions (Signed)
Start taking a prenatal vitamin.  Call your OB to schedule an appointment.  If you have vaginal bleeding, you should be seen right away.

## 2020-06-14 NOTE — ED Triage Notes (Signed)
She had a positive home pregnancy test today. Her menses are irregular but she thinks she had one 2 months ago. Here with vomiting. Cramping abdominal pain.

## 2020-06-14 NOTE — ED Provider Notes (Signed)
[facility] EMERGENCY DEPARTMENT Provider Note   CSN: [id number] Arrival date & time: 06/14/20  1838     History Chief Complaint  Patient presents with  . Emesis During Pregnancy    Veronica Mejia is a 25 y.o. female.  Patient is a G3, P2 who presents with vomiting that started last night and lower abdominal cramping that started this morning.  She reports that she has vomited 12 times since she started vomiting last night around 9.  She has been able to tolerate liquids, unable to tolerate solids.  She states that she has cramping in her lower abdomen, mostly in the middle.  She denies any vaginal bleeding.  She states that she felt exactly like she did with her 2 prior pregnancies, therefore she took a pregnancy test at home.  She had 2 home positive pregnancy test.  She states that she has been very nauseous with her prior pregnancies and has tried multiple things, usually only Zofran helps.  This was a planned pregnancy.  She will be following with [facility] in the future.  LMP was about 2 months ago, not exact, states that she had irregular periods.  Denies any vaginal discharge or concern for STDs.  Denies any fevers.  No known sick contacts.  She lives at home with her husband and 2 children, they do not have any symptoms similar to this.  She denies diarrhea.        Past Medical History:  Diagnosis Date  . Gastroparesis     There are no problems to display for this patient.   Past Surgical History:  Procedure Laterality Date  . CESAREAN SECTION    . CHOLECYSTECTOMY    . GASTROSTOMY TUBE PLACEMENT     has been removed     OB History    Gravida  1   Para      Term      Preterm      AB      Living        SAB      IAB      Ectopic      Multiple      Live Births              No family history on file.  Social History   Tobacco Use  . Smoking status: Former Games developer  . Smokeless tobacco: Never Used  Vaping Use  . Vaping Use:  Never used  Substance Use Topics  . Alcohol use: Not Currently  . Drug use: No    Home Medications Prior to Admission medications   Medication Sig Start Date End Date Taking? Authorizing Provider  Doxylamine-Pyridoxine 10-10 MG TBEC Take 1 tablet by mouth at bedtime. 06/14/20  Yes Kaysie Michelini, Solmon Ice, DO  albuterol (PROVENTIL HFA;VENTOLIN HFA) 108 (90 Base) MCG/ACT inhaler Inhale 2 puffs into the lungs every 4 (four) hours as needed for wheezing or shortness of breath (cough). 03/14/16   Street, Mercedes, PA-C  cephALEXin (KEFLEX) 500 MG capsule Take 1 capsule (500 mg total) by mouth 2 (two) times daily. 07/27/18   Benjiman Core, MD  metoCLOPramide (REGLAN) 10 MG tablet Take 1 tablet (10 mg total) by mouth every 8 (eight) hours as needed for nausea. 07/27/18   Benjiman Core, MD  naproxen (NAPROSYN) 500 MG tablet Take 1 tablet (500 mg total) by mouth 2 (two) times daily. 04/02/16   Renne Crigler, PA-C    Allergies    Hydrocodone-acetaminophen and Sulfa antibiotics  Review of Systems   Review of Systems  Constitutional: Negative for fever.  HENT: Negative for congestion.   Respiratory: Negative for cough and shortness of breath.   Cardiovascular: Negative for chest pain.  Gastrointestinal: Positive for abdominal pain, nausea and vomiting. Negative for diarrhea.  Genitourinary: Negative for decreased urine volume, difficulty urinating, dysuria, vaginal bleeding and vaginal discharge.  Skin: Negative for rash.  Neurological: Negative for headaches.    Physical Exam Updated Vital Signs BP (!) 155/98 (BP Location: Right Wrist)   Pulse (!) 105   Temp 98.3 F (36.8 C) (Oral)   Resp 20   Ht 5\' 7"  (1.702 m)   Wt (!) 187.6 kg   SpO2 100%   BMI 64.78 kg/m   Physical Exam Constitutional:      General: She is not in acute distress.    Appearance: She is not ill-appearing, toxic-appearing or diaphoretic.  HENT:     Head: Normocephalic and atraumatic.     Mouth/Throat:      Mouth: Mucous membranes are moist.  Cardiovascular:     Rate and Rhythm: Normal rate and regular rhythm.     Pulses: Normal pulses.     Comments: Regular rate <100 on exam Pulmonary:     Effort: Pulmonary effort is normal.     Breath sounds: Normal breath sounds.  Abdominal:     Palpations: Abdomen is soft.     Tenderness: There is abdominal tenderness (mild diffuse TTP upper abdomen). There is no guarding or rebound.  Musculoskeletal:        General: No swelling.  Skin:    General: Skin is warm and dry.     Capillary Refill: Capillary refill takes less than 2 seconds.  Neurological:     General: No focal deficit present.     Mental Status: She is alert and oriented to person, place, and time.  Psychiatric:        Mood and Affect: Mood normal.        Behavior: Behavior normal.     ED Results / Procedures / Treatments   Labs (all labs ordered are listed, but only abnormal results are displayed) Labs Reviewed  URINALYSIS, ROUTINE W REFLEX MICROSCOPIC - Abnormal; Notable for the following components:      Result Value   APPearance HAZY (*)    Specific Gravity, Urine >1.030 (*)    Bilirubin Urine SMALL (*)    All other components within normal limits  PREGNANCY, URINE - Abnormal; Notable for the following components:   Preg Test, Ur POSITIVE (*)    All other components within normal limits  COMPREHENSIVE METABOLIC PANEL - Abnormal; Notable for the following components:   Glucose, Bld 102 (*)    Calcium 8.7 (*)    All other components within normal limits    EKG None  Radiology OB LESS THAN 14 WEEKS WITH OB TRANSVAGINAL  Result Date: 06/14/2020 CLINICAL DATA:  Abdominal cramping. EXAM: OBSTETRIC <14 WK 06/16/2020 AND TRANSVAGINAL OB US TECHNIQUE: Both transabdominal and transvaginal ultrasound examinations were performed for complete evaluation of the gestation as well as the maternal uterus, adnexal regions, and pelvic cul-de-sac. Transvaginal technique was performed to  assess early pregnancy. COMPARISON:  None. FINDINGS: Intrauterine gestational sac: Single Yolk sac:  Visualized. Embryo:  Not Visualized. Cardiac Activity: Not Visualized. Heart Rate: N/A  bpm MSD: 6 mm   5 w   2 d Subchorionic hemorrhage:  Small Maternal uterus/adnexae: The bilateral ovaries are visualized and are normal in appearance.  A small amount of pelvic free fluid is seen. IMPRESSION: 1. Single intrauterine gestational sac and yolk sac, at approximately 5 weeks and 2 days gestation by ultrasound evaluation, without visualization of a fetal pole. While this may be secondary to an early intrauterine pregnancy, correlation with follow-up pelvic ultrasound and serial beta HCG levels is recommended. 2. Small subchorionic hemorrhage. Electronically Signed   By: Aram Candela M.D.   On: 06/14/2020 20:00    Procedures Procedures   Medications Ordered in ED Medications  ondansetron (ZOFRAN-ODT) disintegrating tablet 4 mg (4 mg Oral Given 06/14/20 2004)    ED Course  I have reviewed the triage vital signs and the nursing notes.  Pertinent labs & imaging results that were available during my care of the patient were reviewed by me and considered in my medical decision making (see chart for details).    MDM Rules/Calculators/A&P                          Patient is a 25 year old G3, P2 who presents with vomiting and abdominal cramping in the setting of recent home pregnancy test.  LMP not exact, about 2 months ago.  Has a history of irregular periods.  She does not have any vaginal bleeding, no need for Rh status.  Given her cramping, will perform an ultrasound to rule out ectopic pregnancy.  Will give Zofran x1 to trial for improvement in her nausea and vomiting.  Given that she has had 12 episodes of emesis, will also obtain baseline LFTs and electrolytes to assess for dehydration and abnormal potassium.  UA is negative for infection.  Initial blood pressure elevated and initial heart rate  tachycardic to 120s.  Patient reports that she was very anxious when this was performed as she had just been told that her husband cannot come back with her.  Will allow patient to rest with her husband in the room and repeat vitals.  CMP is without abnormality.  OB ultrasound with intrauterine gestational sac and yolk sac approximately 5 weeks and 2 days, without visualization of fetal pole.  Also has small subchorionic hemorrhage.  Discussed with patient that she will need to follow-up with OB for serial beta hCG levels and repeat ultrasound to confirm fetal pole.  Recommended avoiding teratogenic medications, specifically ibuprofen.  Advised using Tylenol for pain.  Also advised using prenatal vitamins.  Rx sent for diclegis to use for nausea.  Vitals were stable and patient was discharged home in stable condition.    Final Clinical Impression(s) / ED Diagnoses Final diagnoses:  Nausea and vomiting during pregnancy  Less than [redacted] weeks gestation of pregnancy    Rx / DC Orders ED Discharge Orders         Ordered    Doxylamine-Pyridoxine 10-10 MG TBEC  Daily at bedtime        06/14/20 2053           Brittaney Beaulieu, Solmon Ice, DO 06/14/20 2055    Little, Ambrose Finland, MD 06/18/20 [phone]

## 2021-03-29 ENCOUNTER — Other Ambulatory Visit: Payer: Self-pay

## 2021-03-29 ENCOUNTER — Encounter: Payer: Self-pay | Admitting: Emergency Medicine

## 2021-03-29 DIAGNOSIS — Z5321 Procedure and treatment not carried out due to patient leaving prior to being seen by health care provider: Secondary | ICD-10-CM | POA: Diagnosis not present

## 2021-03-29 DIAGNOSIS — R1033 Periumbilical pain: Secondary | ICD-10-CM | POA: Diagnosis not present

## 2021-03-29 DIAGNOSIS — R198 Other specified symptoms and signs involving the digestive system and abdomen: Secondary | ICD-10-CM | POA: Diagnosis not present

## 2021-03-29 LAB — COMPREHENSIVE METABOLIC PANEL
ALT: 32 U/L (ref 0–44)
AST: 24 U/L (ref 15–41)
Albumin: 3.7 g/dL (ref 3.5–5.0)
Alkaline Phosphatase: 77 U/L (ref 38–126)
Anion gap: 9 (ref 5–15)
BUN: 13 mg/dL (ref 6–20)
CO2: 28 mmol/L (ref 22–32)
Calcium: 9.3 mg/dL (ref 8.9–10.3)
Chloride: 102 mmol/L (ref 98–111)
Creatinine, Ser: 0.97 mg/dL (ref 0.44–1.00)
GFR, Estimated: >60 mL/min (ref >60)
Glucose, Bld: 105 mg/dL — ABNORMAL HIGH (ref 70–99)
Potassium: 4 mmol/L (ref 3.5–5.1)
Sodium: 139 mmol/L (ref 135–145)
Total Bilirubin: 0.4 mg/dL (ref 0.3–1.2)
Total Protein: 6.8 g/dL (ref 6.5–8.1)

## 2021-03-29 LAB — CBC
HCT: 41.3 % (ref 36.0–46.0)
Hemoglobin: 13.6 g/dL (ref 12.0–15.0)
MCH: 30.2 pg (ref 26.0–34.0)
MCHC: 32.9 g/dL (ref 30.0–36.0)
MCV: 91.8 fL (ref 80.0–100.0)
Platelets: 184 10*3/uL (ref 150–400)
RBC: 4.5 MIL/uL (ref 3.87–5.11)
RDW: 12.6 % (ref 11.5–15.5)
WBC: 6.5 10*3/uL (ref 4.0–10.5)
nRBC: 0 % (ref 0.0–0.2)

## 2021-03-29 NOTE — ED Triage Notes (Signed)
Pt presents to ER accompanied by husband. Pt reports she has been bleeding from her umbilical area for about 2 weeks. Reports she went to her PCP and was given antibiotics and ointment. Pt reports she continues to bleed. Reports had gallbladder procedure many yrs ago and has had abscess to umbilical area. Pt talks in complete sentences no respiratory distress noted

## 2021-03-30 ENCOUNTER — Emergency Department
Admission: EM | Admit: 2021-03-30 | Discharge: 2021-03-30 | Disposition: A | Discharge status: Home / Self Care | Payer: Medicaid Other | Attending: Emergency Medicine | Admitting: Emergency Medicine

## 2021-04-25 ENCOUNTER — Emergency Department (HOSPITAL_COMMUNITY)
Admission: EM | Admit: 2021-04-25 | Discharge: 2021-04-26 | Discharge status: Against Medical Advice | Payer: Medicaid Other | Attending: Emergency Medicine | Admitting: Emergency Medicine

## 2021-04-25 ENCOUNTER — Other Ambulatory Visit: Payer: Self-pay

## 2021-04-25 DIAGNOSIS — L03316 Cellulitis of umbilicus: Secondary | ICD-10-CM | POA: Insufficient documentation

## 2021-04-25 DIAGNOSIS — Z5321 Procedure and treatment not carried out due to patient leaving prior to being seen by health care provider: Secondary | ICD-10-CM | POA: Insufficient documentation

## 2021-04-25 NOTE — ED Triage Notes (Signed)
Pt treated for umbilical infection 1 month ago with Clindamycin without improvement. Pt states "my belly button is bleeding".

## 2021-04-26 ENCOUNTER — Other Ambulatory Visit: Payer: Self-pay

## 2021-04-26 ENCOUNTER — Ambulatory Visit
Admission: EM | Admit: 2021-04-26 | Discharge: 2021-04-26 | Disposition: A | Discharge status: Home / Self Care | Payer: Medicaid Other | Attending: Family Medicine | Admitting: Family Medicine

## 2021-04-26 DIAGNOSIS — L02216 Cutaneous abscess of umbilicus: Secondary | ICD-10-CM

## 2021-04-26 MED ORDER — MUPIROCIN 2 % EX OINT: 1.0000 "application " | TOPICAL_OINTMENT | Freq: Two times a day (BID) | CUTANEOUS | 0 refills | Status: DC

## 2021-04-26 MED ORDER — DOXYCYCLINE HYCLATE 100 MG PO CAPS: 100.0000 mg | ORAL_CAPSULE | Freq: Two times a day (BID) | ORAL | 0 refills | Status: DC

## 2021-04-26 NOTE — ED Provider Notes (Signed)
[facility]    CSN: [id number] Arrival date & time: 04/26/21  1119      History   Chief Complaint Chief Complaint  Patient presents with   belly button swollen and bleeding   HPI Veronica Mejia is a 26 y.o. female.   Presenting today with over a month of umbilical irritation, drainage.  States she was seen about a month ago and placed on clindamycin which did help improve the issue but never resolved it.  The past day or so has had a recurrence of bleeding, yellow drainage from the umbilicus and some surrounding abdominal pain.  Denies fever, chills, nausea, vomiting, diarrhea, constipation, injury to the area.  Has been trying ibuprofen and Tylenol with no relief.  Past Medical History:  Diagnosis Date   Gastroparesis     There are no problems to display for this patient.   Past Surgical History:  Procedure Laterality Date   CESAREAN SECTION     CHOLECYSTECTOMY     GASTROSTOMY TUBE PLACEMENT     has been removed    OB History     Gravida  1   Para      Term      Preterm      AB      Living         SAB      IAB      Ectopic      Multiple      Live Births             Home Medications    Prior to Admission medications   Medication Sig Start Date End Date Taking? Authorizing Provider  doxycycline (VIBRAMYCIN) 100 MG capsule Take 1 capsule (100 mg total) by mouth 2 (two) times daily. 04/26/21  Yes Particia Nearing, PA-C  mupirocin ointment (BACTROBAN) 2 % Apply 1 application topically 2 (two) times daily. 04/26/21  Yes Particia Nearing, PA-C  albuterol (PROVENTIL HFA;VENTOLIN HFA) 108 (90 Base) MCG/ACT inhaler Inhale 2 puffs into the lungs every 4 (four) hours as needed for wheezing or shortness of breath (cough). 03/14/16   Street, [place], PA-C  cephALEXin (KEFLEX) 500 MG capsule Take 1 capsule (500 mg total) by mouth 2 (two) times daily. 07/27/18   Benjiman Core, MD  Doxylamine-Pyridoxine 10-10 MG TBEC Take 1 tablet  by mouth at bedtime. 06/14/20   Meccariello, Solmon Ice, DO  metoCLOPramide (REGLAN) 10 MG tablet Take 1 tablet (10 mg total) by mouth every 8 (eight) hours as needed for nausea. 07/27/18   Benjiman Core, MD  naproxen (NAPROSYN) 500 MG tablet Take 1 tablet (500 mg total) by mouth 2 (two) times daily. 04/02/16   Renne Crigler, PA-C   Family History No family history on file.  Social History Social History   Tobacco Use   Smoking status: Former   Smokeless tobacco: Never  Building services engineer Use: Never used  Substance Use Topics   Alcohol use: Not Currently   Drug use: No     Allergies   Hydrocodone-acetaminophen and Sulfa antibiotics   Review of Systems Review of Systems Per HPI  Physical Exam Triage Vital Signs ED Triage Vitals  Enc Vitals Group     BP 04/26/21 1135 (!) 148/81     Pulse Rate 04/26/21 1135 82     Resp 04/26/21 1135 18     Temp 04/26/21 1135 98.6 F (37 C)     Temp Source 04/26/21 1135 Oral  SpO2 04/26/21 1135 99 %     Weight --      Height --      Head Circumference --      Peak Flow --      Pain Score 04/26/21 1132 6     Pain Loc --      Pain Edu? --      Excl. in GC? --    No data found.  Updated Vital Signs BP (!) 148/81 (BP Location: Right Arm)    Pulse 82    Temp 98.6 F (37 C) (Oral)    Resp 18    LMP 04/11/2021 (Approximate)    SpO2 99%   Visual Acuity Right Eye Distance:   Left Eye Distance:   Bilateral Distance:    Right Eye Near:   Left Eye Near:    Bilateral Near:     Physical Exam Vitals and nursing note reviewed.  Constitutional:      Appearance: Normal appearance. She is not ill-appearing.  HENT:     Head: Atraumatic.  Eyes:     Extraocular Movements: Extraocular movements intact.     Conjunctiva/sclera: Conjunctivae normal.  Cardiovascular:     Rate and Rhythm: Normal rate and regular rhythm.     Heart sounds: Normal heart sounds.  Pulmonary:     Effort: Pulmonary effort is normal.     Breath sounds:  Normal breath sounds.  Abdominal:     General: Bowel sounds are normal. There is no distension.     Palpations: Abdomen is soft.     Tenderness: There is abdominal tenderness. There is no right CVA tenderness, left CVA tenderness or guarding.     Comments: Mild tenderness to palpation without distention or guarding surrounding the umbilicus  Musculoskeletal:        General: Normal range of motion.     Cervical back: Normal range of motion and neck supple.  Skin:    General: Skin is warm.     Comments: Dried blood present at the umbilicus, no spontaneous drainage or bleeding and no expression of drainage with palpation to the area  Neurological:     Mental Status: She is alert and oriented to person, place, and time.  Psychiatric:        Mood and Affect: Mood normal.        Thought Content: Thought content normal.        Judgment: Judgment normal.     UC Treatments / Results  Labs (all labs ordered are listed, but only abnormal results are displayed) Labs Reviewed - No data to display  EKG   Radiology No results found.  Procedures Procedures (including critical care time)  Medications Ordered in UC Medications - No data to display  Initial Impression / Assessment and Plan / UC Course  I have reviewed the triage vital signs and the nursing notes.  Pertinent labs & imaging results that were available during my care of the patient were reviewed by me and considered in my medical decision making (see chart for details).     We will start antibiotics, Bactroban ointment topically and follow-up with [place] surgery for further evaluation given duration of symptoms.  Work note given.  Final Clinical Impressions(s) / UC Diagnoses   Final diagnoses:  Abscess, umbilical   Discharge Instructions   None    ED Prescriptions     Medication Sig Dispense Auth. Provider   doxycycline (VIBRAMYCIN) 100 MG capsule Take 1 capsule (100 mg total) by  mouth 2 (two) times  daily. 14 capsule Particia Nearing, [place]   mupirocin ointment (BACTROBAN) 2 % Apply 1 application topically 2 (two) times daily. 22 g Particia Nearing, [place]      PDMP not reviewed this encounter.   Particia Nearing, [place] 04/26/21 1238

## 2021-04-26 NOTE — ED Triage Notes (Signed)
Patient states that a month ago she was given meds to help her belly button and she states that it helped a little but didn't completely healed  Patient states her belly button is still bleeding and sharp pains were shooting up her stomach  Patient states she has tried Tylenol and Motrin, last dose today without any relief  Denies Fever

## 2021-05-19 ENCOUNTER — Encounter (HOSPITAL_COMMUNITY): Payer: Self-pay | Admitting: Emergency Medicine

## 2021-05-19 ENCOUNTER — Emergency Department (HOSPITAL_COMMUNITY)
Admission: EM | Admit: 2021-05-19 | Discharge: 2021-05-19 | Disposition: A | Discharge status: Home / Self Care | Payer: Medicaid Other | Attending: Emergency Medicine | Admitting: Emergency Medicine

## 2021-05-19 ENCOUNTER — Other Ambulatory Visit: Payer: Self-pay

## 2021-05-19 DIAGNOSIS — R1033 Periumbilical pain: Secondary | ICD-10-CM | POA: Diagnosis present

## 2021-05-19 MED ORDER — DOXYCYCLINE HYCLATE 100 MG PO CAPS: 100.0000 mg | ORAL_CAPSULE | Freq: Two times a day (BID) | ORAL | 0 refills | Status: AC

## 2021-05-19 NOTE — ED Triage Notes (Signed)
Pt reports she has 2-3 abscesses that are causing her to bleed from her umbilicus; no bleeding noted during triage, pt reports Tylenol and Motrin are not helping with her pain and she missed work x 2 days due to it ?

## 2021-05-19 NOTE — Discharge Instructions (Signed)
Doxycycline twice daily ?See your surgeon at your appointment on Thursday ?

## 2021-05-19 NOTE — ED Provider Notes (Signed)
?[facility] ?Provider Note ? ? ?CSN: [id number] ?Arrival date & time: 05/19/21  2022 ? ?  ? ?History ? ?Chief Complaint  ?Patient presents with  ? Abscess  ? ? ?Veronica Mejia is a 26 y.o. female. ? ? ?Abscess ?Associated symptoms: no fever   ? ?This patient reports that she is supposed to be seeing her general surgeon in [facility], a Dr. Cliffton Asters on Thursday of this coming week, they are supposed to be doing a surgery to drain abscesses from her umbilicus.  She has had these for quite some time and they cause ongoing pain and occasional bleeding, she is not having any bleeding at this time and she has no fevers or chills.  She has been on multiple courses of antibiotics over the last several months and states that it helps for a while and then the things come back. ? ? ? ?Home Medications ?Prior to Admission medications   ?Medication Sig Start Date End Date Taking? Authorizing Provider  ?doxycycline (VIBRAMYCIN) 100 MG capsule Take 1 capsule (100 mg total) by mouth 2 (two) times daily for 5 days. 05/19/21 05/24/21 Yes Eber Hong, MD  ?albuterol (PROVENTIL HFA;VENTOLIN HFA) 108 (90 Base) MCG/ACT inhaler Inhale 2 puffs into the lungs every 4 (four) hours as needed for wheezing or shortness of breath (cough). 03/14/16   Street, [place], PA-C  ?Doxylamine-Pyridoxine 10-10 MG TBEC Take 1 tablet by mouth at bedtime. 06/14/20   Meccariello, Solmon Ice, DO  ?metoCLOPramide (REGLAN) 10 MG tablet Take 1 tablet (10 mg total) by mouth every 8 (eight) hours as needed for nausea. 07/27/18   Benjiman Core, MD  ?mupirocin ointment (BACTROBAN) 2 % Apply 1 application topically 2 (two) times daily. 04/26/21   Particia Nearing, PA-C  ?   ? ?Allergies    ?Hydrocodone-acetaminophen and Sulfa antibiotics   ? ?Review of Systems   ?Review of Systems  ?Constitutional:  Negative for fever.  ?Skin:  Positive for rash.  ? ?Physical Exam ?Updated Vital Signs ?BP 140/71   Pulse 92   Temp 98.6 ?F (37 ?C) (Oral)    Resp 19   Ht 1.702 m (5\' 7" )   Wt (!) 186.1 kg   LMP 04/16/2021   SpO2 100%   BMI 64.26 kg/m?  ?Physical Exam ?Vitals and nursing note reviewed.  ?Constitutional:   ?   Appearance: She is well-developed. She is not diaphoretic.  ?   Comments: Very high body mass index, no acute distress  ?HENT:  ?   Head: Normocephalic and atraumatic.  ?Eyes:  ?   General:     ?   Right eye: No discharge.     ?   Left eye: No discharge.  ?   Conjunctiva/sclera: Conjunctivae normal.  ?Pulmonary:  ?   Effort: Pulmonary effort is normal. No respiratory distress.  ?Abdominal:  ?   Palpations: Abdomen is soft.  ?   Comments: Morbidly obese abdomen, very rotund, extremely soft, minimal tenderness around the umbilicus, no visible abscesses no palpable abscesses or tenderness or induration, no redness or drainage or foul smell  ?Musculoskeletal:  ?   Right lower leg: No edema.  ?   Left lower leg: No edema.  ?Skin: ?   General: Skin is warm and dry.  ?   Findings: No erythema or rash.  ?Neurological:  ?   Mental Status: She is alert.  ?   Coordination: Coordination normal.  ? ? ?ED Results / Procedures / Treatments   ?  Labs ?(all labs ordered are listed, but only abnormal results are displayed) ?Labs Reviewed - No data to display ? ?EKG ?None ? ?Radiology ?No results found. ? ?Procedures ?Procedures  ? ? ?Medications Ordered in ED ?Medications - No data to display ? ?ED Course/ Medical Decision Making/ A&P ?  ?                        ?Medical Decision Making ? ?Patient appears benign, will give short course of doxycycline to get her back to see her doctor on Thursday, no need for aggressive interventions or evaluations.  I did review the medical record and found no recent imaging of this patient's abdomen.  Stable for outpt f/u. ? ? ? ? ? ? ? ?Final Clinical Impression(s) / ED Diagnoses ?Final diagnoses:  ?Periumbilical abdominal pain  ? ? ?Rx / DC Orders ?ED Discharge Orders   ? ?      Ordered  ?  doxycycline (VIBRAMYCIN) 100 MG  capsule  2 times daily       ? 05/19/21 2128  ? ?  ?  ? ?  ? ? ?  ?Eber Hong, MD ?05/19/21 2129 ? ?

## 2021-07-01 ENCOUNTER — Emergency Department (HOSPITAL_COMMUNITY): Payer: Medicaid Other

## 2021-07-01 ENCOUNTER — Emergency Department (HOSPITAL_COMMUNITY)
Admission: EM | Admit: 2021-07-01 | Discharge: 2021-07-01 | Disposition: A | Discharge status: Home / Self Care | Payer: Medicaid Other | Attending: Emergency Medicine | Admitting: Emergency Medicine

## 2021-07-01 ENCOUNTER — Encounter (HOSPITAL_COMMUNITY): Payer: Self-pay

## 2021-07-01 ENCOUNTER — Other Ambulatory Visit: Payer: Self-pay

## 2021-07-01 DIAGNOSIS — W010XXA Fall on same level from slipping, tripping and stumbling without subsequent striking against object, initial encounter: Secondary | ICD-10-CM | POA: Diagnosis not present

## 2021-07-01 DIAGNOSIS — S8011XA Contusion of right lower leg, initial encounter: Secondary | ICD-10-CM | POA: Insufficient documentation

## 2021-07-01 DIAGNOSIS — Y990 Civilian activity done for income or pay: Secondary | ICD-10-CM | POA: Diagnosis not present

## 2021-07-01 DIAGNOSIS — W19XXXA Unspecified fall, initial encounter: Secondary | ICD-10-CM

## 2021-07-01 DIAGNOSIS — M25561 Pain in right knee: Secondary | ICD-10-CM | POA: Diagnosis not present

## 2021-07-01 DIAGNOSIS — M25571 Pain in right ankle and joints of right foot: Secondary | ICD-10-CM | POA: Insufficient documentation

## 2021-07-01 DIAGNOSIS — S8991XA Unspecified injury of right lower leg, initial encounter: Secondary | ICD-10-CM | POA: Diagnosis present

## 2021-07-01 DIAGNOSIS — M79604 Pain in right leg: Secondary | ICD-10-CM

## 2021-07-01 MED ORDER — NAPROXEN 500 MG PO TABS: 500.0000 mg | ORAL_TABLET | Freq: Two times a day (BID) | ORAL | 0 refills | Status: DC

## 2021-07-01 NOTE — Discharge Instructions (Addendum)
A prescription for naproxen has been sent to your pharmacy.  You may take 1 tablet every 12 hours as needed for pain relief.  Always take with plenty of food and water. ? ?RICE therapy further instructions have been provided for you in your discharge paperwork.  Please review these.  Also remain nonweightbearing on your right leg until you are able to follow-up with orthopedics. ? ?You have also been provided the contact information for a local orthopedic provider by the name of Dr. Aline Brochure.  Please call and schedule an appointment first thing tomorrow morning to be seen within the next few days for reevaluation and further treatment. ? ?Return to the ED for new or worsening symptoms as discussed. ?

## 2021-07-01 NOTE — ED Provider Notes (Signed)
?[facility] ?Provider Note ? ? ?CSN: [id number] ?Arrival date & time: 07/01/21  1611 ? ?  ? ?History ? ?Chief Complaint  ?Patient presents with  ? Leg Injury  ? ? ?Veronica Mejia is a 26 y.o. female with chief complaint of right lower leg pain.  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  This resulted in her leg getting hit on a metal ledge on the way down.  Pain of right knee, shin, and ankle.  Described as constant and aching.  Denies numbness or tingling of the lower extremity.  Without open wound.  Denies previous surgery or injury of this extremity.  No obvious deformity/malalignment. ? ?The history is provided by the patient and medical records.  ? ?  ? ?Home Medications ?Prior to Admission medications   ?Medication Sig Start Date End Date Taking? Authorizing Provider  ?albuterol (PROVENTIL HFA;VENTOLIN HFA) 108 (90 Base) MCG/ACT inhaler Inhale 2 puffs into the lungs every 4 (four) hours as needed for wheezing or shortness of breath (cough). 03/14/16   Street, [place], PA-C  ?Doxylamine-Pyridoxine 10-10 MG TBEC Take 1 tablet by mouth at bedtime. 06/14/20   Meccariello, Solmon Ice, DO  ?metoCLOPramide (REGLAN) 10 MG tablet Take 1 tablet (10 mg total) by mouth every 8 (eight) hours as needed for nausea. 07/27/18   Benjiman Core, MD  ?mupirocin ointment (BACTROBAN) 2 % Apply 1 application topically 2 (two) times daily. 04/26/21   Particia Nearing, PA-C  ?   ? ?Allergies    ?Hydrocodone-acetaminophen and Sulfa antibiotics   ? ?Review of Systems   ?Review of Systems  ?Musculoskeletal:   ?     Right knee pain, ankle pain, and shin pain  ? ?Physical Exam ?Updated Vital Signs ?BP 126/86 (BP Location: Right Wrist)   Pulse (!) 110   Temp 97.8 ?F (36.6 ?C) (Oral)   Resp 19   Ht 5\' 7"  (1.702 m)   Wt 133.8 kg   LMP 06/17/2021 (Approximate)   SpO2 100%   BMI 46.20 kg/m?  ?Physical Exam ?Vitals and nursing note reviewed.  ?Constitutional:   ?    General: She is not in acute distress. ?   Appearance: She is well-developed.  ?HENT:  ?   Head: Normocephalic and atraumatic.  ?Eyes:  ?   Conjunctiva/sclera: Conjunctivae normal.  ?Cardiovascular:  ?   Rate and Rhythm: Normal rate and regular rhythm.  ?   Pulses: Normal pulses.  ?   Heart sounds: No murmur heard. ?Pulmonary:  ?   Effort: Pulmonary effort is normal. No respiratory distress.  ?   Breath sounds: Normal breath sounds.  ?Abdominal:  ?   Palpations: Abdomen is soft.  ?   Tenderness: There is no abdominal tenderness.  ?Musculoskeletal:     ?   General: No swelling.  ?   Cervical back: Neck supple.  ?     Legs: ? ?   Comments: Ecchymosis and soft tissue swelling as indicated above.  RLE without evidence of malalignment, laceration, obvious deformity, rash, abnormal pulse, or poor CRT.  Mild tenderness over both areas indicated above.  Mild tenderness elicited with ankle ROM.  Compartments soft.   ?Skin: ?   General: Skin is warm and dry.  ?   Capillary Refill: Capillary refill takes less than 2 seconds.  ?   Findings: Bruising present. No erythema or rash.  ?Neurological:  ?   Mental Status: She is alert and  oriented to person, place, and time.  ?Psychiatric:     ?   Mood and Affect: Mood normal.  ? ? ?ED Results / Procedures / Treatments   ?Labs ?(all labs ordered are listed, but only abnormal results are displayed) ?Labs Reviewed - No data to display ? ?EKG ?None ? ?Radiology ?CLINICAL DATA:  Blunt trauma to the right lower extremity, initial  ?encounter  ?   ?EXAM:  ?RIGHT TIBIA AND FIBULA - 2 VIEW  ?   ?COMPARISON:  None.  ?   ?FINDINGS:  ?Mild anterior soft tissue swelling is noted. No acute fracture or  ?dislocation is seen.  ?   ?IMPRESSION:  ?Mild soft tissue swelling anteriorly without acute bony abnormality.  ?   ?   ?Electronically Signed  ?  By: Alcide CleverMark  Lukens M.D.  ?  On: 07/01/2021 22:31  ? ?CLINICAL DATA:  Ankle pain  ?   ?EXAM:  ?RIGHT ANKLE - COMPLETE 3+ VIEW  ?   ?COMPARISON:  Ankle  radiograph 11/11/2014  ?   ?FINDINGS:  ?There is generalized ankle soft tissue swelling. There is a small  ?bony fragment at the tip of the lateral malleolus, which appears at  ?least partially well corticated.  ?   ?IMPRESSION:  ?Generalized ankle soft tissue swelling. Small bony fragment at the  ?tip of lateral malleolus which appears at least partially well  ?corticated, favored to be sequela of prior injury, though would  ?correlate with point tenderness.  ?   ?   ?Electronically Signed  ?  By: Caprice RenshawJacob  Kahn M.D.  ?  On: 07/01/2021 17:11  ? ?CLINICAL DATA:  Knee Pain  ?   ?EXAM:  ?RIGHT KNEE - COMPLETE 4+ VIEW  ?   ?COMPARISON:  None.  ?   ?FINDINGS:  ?There is no evidence of acute fracture. There is tricompartment  ?osteophyte formation with mild joint space narrowing. There is no  ?significant joint effusion.  ?   ?IMPRESSION:  ?No evidence of acute fracture.  Mild tricompartment osteoarthritis.  ?   ?   ?Electronically Signed  ?  By: Caprice RenshawJacob  Kahn M.D.  ?  On: 07/01/2021 17:10  ? ? ? ?Procedures ?Procedures  ? ? ?Medications Ordered in ED ?Medications - No data to display ? ?ED Course/ Medical Decision Making/ A&P ?  ?                        ?Medical Decision Making ?Amount and/or Complexity of Data Reviewed ?External Data Reviewed: notes. ?Labs:  Decision-making details documented in ED Course. ?Radiology: ordered and independent interpretation performed. Decision-making details documented in ED Course. ?ECG/medicine tests:  Decision-making details documented in ED Course. ? ?Risk ?OTC drugs. ?Prescription drug management. ? ? ?26 y.o. female presents to the ED for concern of Leg Injury ? Marland Kitchen.  This involves an extensive number of treatment options, and is a complaint that carries with it a high risk of complications and morbidity.  The emergent differential diagnosis prior to evaluation includes, but is not limited to: contusion, fracture, dislocation, sprain ? ?This is not an exhaustive differential.  ? ?Past  Medical History / Co-morbidities / Social History: ?Gastroparesis, former tobacco use ? ?Additional History:  ?Internal and external records from outside source obtained and reviewed including ED visits, general surgery ? ?Physical Exam: ?Physical exam performed. The pertinent findings include: Mild ecchymosis and mild bony tenderness of right tibia/fibula.  Tenderness, soft tissue swelling, and bony tenderness of  right ankle.  2+ DP and PT pulses.  Full but mildly tender ROM of right ankle. ? ?Lab Tests: ?None ? ?Imaging Studies: ?I ordered imaging studies including XR imaging of right knee, tibia/fibula, and ankle .  I independently visualized and interpreted said imaging.  Pertinent results include: ?XR right knee: Negative ?XR right tibia/fibula: Negative ?XR right ankle: Generalized ankle soft tissue swelling. Small bony fragment at the tip of lateral malleolus which appears at least partially well corticated, favored to be sequela of prior injury, though would correlate with point tenderness ?I agree with the radiologist interpretation. ? ?Medications: ?None ? ?ED Course/Disposition: ?Pt well-appearing on exam.  Complains of pain to right knee, ankle, and shin.  Not suspicious of compartment syndrome or DVT.  Knee described as very mild, but not of concern to pt.  Her highest concern is the ankle.  Patient X-Ray negative for obvious fracture or dislocation.  X-Ray imaging of ankle suggestive of small bony fragment possibly due to new or old injury, and recommends clinical correlation.  Tenderness over correlated site.  Recommended follow up with Orthopedics for further treatment and re-evaluation.  Pain managed in ED.  Patient provided boot and crutches while in ED.  Recommend NWB, anti-inflammatories, and conservative management until follow up. ? ?After consideration of the diagnostic results and the patient's encounter today, I feel that the emergency department workup does not suggest an emergent condition  requiring admission or immediate intervention beyond what has been performed at this time.  The patient is safe for discharge and has been instructed to return immediately for worsening symptoms, change in symptoms or a

## 2021-07-01 NOTE — ED Triage Notes (Signed)
Patient with complaints of right knee and ankle pain that happened after tripping over some debris at work and falling.  ?

## 2021-07-04 ENCOUNTER — Encounter: Payer: Self-pay | Admitting: Orthopedic Surgery

## 2021-07-04 ENCOUNTER — Ambulatory Visit (INDEPENDENT_AMBULATORY_CARE_PROVIDER_SITE_OTHER): Payer: Medicaid Other | Admitting: Orthopedic Surgery

## 2021-07-04 VITALS — BP 175/110 | HR 128 | Ht 67.0 in | Wt 300.0 lb

## 2021-07-04 DIAGNOSIS — Z6841 Body Mass Index (BMI) 40.0 and over, adult: Secondary | ICD-10-CM

## 2021-07-04 DIAGNOSIS — S93491A Sprain of other ligament of right ankle, initial encounter: Secondary | ICD-10-CM

## 2021-07-04 MED ORDER — IBUPROFEN 800 MG PO TABS: 800.0000 mg | ORAL_TABLET | Freq: Three times a day (TID) | ORAL | 0 refills | Status: DC | PRN

## 2021-07-04 NOTE — Patient Instructions (Addendum)
Soak your foot in epsom salt to help with pain.  ? ?20 x daily move foot towards your head and back down ? ?Ibuprofen 800mg  three times daily ? ?Weight Bearing as tolerated in boot using crutches ? ?Follow up with Dr.Harrison on 07/15/21 ? ?OOW note until after next appt ?

## 2021-07-05 ENCOUNTER — Encounter: Payer: Self-pay | Admitting: Orthopedic Surgery

## 2021-07-05 NOTE — Progress Notes (Signed)
Chief Complaint  ?Patient presents with  ? Ankle Injury  ?  RT ankle/DOI 07/01/21 ?Fell at [employer] in [employer] and on [employer], [employer] at [employer]  ? ? ?HPI: 26 yo fell at work c/o right ankle pain swelling and stiffness  ? ?Past Medical History:  ?Diagnosis Date  ? Gastroparesis   ? ? ?BP (!) 175/110   Pulse (!) 128   Ht 5\' 7"  (1.702 m)   Wt 300 lb (136.1 kg)   LMP 06/17/2021 (Approximate)   BMI 46.99 kg/m?  ? ?The patient meets the AMA guidelines for Morbid (severe) obesity with a BMI > 40.0 and I have recommended weight loss. ? ?General appearance: Well-developed well-nourished no gross deformities ? ?Cardiovascular normal pulse and perfusion normal color without edema ? ?Neurologically no sensation loss or deficits or pathologic reflexes ? ?Psychological: Awake alert and oriented x3 mood and affect normal ? ?Skin no lacerations or ulcerations no nodularity no palpable masses, no erythema or nodularity ? ?Musculoskeletal: ? ?Right Ankle Exam  ? ?Tenderness  ?The patient is experiencing tenderness in the ATF. ?Swelling: moderate ? ?Range of Motion  ?Dorsiflexion:  abnormal  ?Plantar flexion:  abnormal  ?Eversion:  abnormal  ?Inversion:  abnormal  ? ?Muscle Strength  ?The patient has normal right ankle strength. ? ?Tests  ?Anterior drawer: positive ? ?Other  ?Erythema: absent ?Scars: absent ?Sensation: normal ?Pulse: present  ? ? ?  ? ?Imaging my interpretation of the images : small avulsion lateral mall, sprain equivalent  ? ?A/P ? ?Start WB with boot and crutches ?AROM ?Ibuprofen  ? ?Meds ordered this encounter  ?Medications  ? ibuprofen (ADVIL) 800 MG tablet  ?  Sig: Take 1 tablet (800 mg total) by mouth every 8 (eight) hours as needed.  ?  Dispense:  90 tablet  ?  Refill:  0  ? ? ?

## 2021-07-15 ENCOUNTER — Ambulatory Visit: Payer: Medicaid Other | Admitting: Orthopedic Surgery

## 2021-07-17 ENCOUNTER — Telehealth: Payer: Self-pay | Admitting: Orthopedic Surgery

## 2021-07-17 NOTE — Telephone Encounter (Signed)
Spoke with patient and advised her that Dr. Romeo Apple was in surgery this afternoon and the other provider has no availability. She would need to keep her appt to see Dr. Romeo Apple tomorrow. She stated she would just go to the hospital. Told her to call us back if she decided not to keep her appt tomorrow. She verbalized understanding.  ?

## 2021-07-17 NOTE — Telephone Encounter (Signed)
Patient called to ask if she may be seen today, 07/17/21, instead of tomorrow, 07/17/21, which is her scheduled appointment date with Dr Aline Brochure. I relayed that Dr Aline Brochure is not in clinic and in surgery at this time. Patient then stated that she would like to see one of our other doctors; said that she was told by Dr Aline Brochure she can put weight on her ankle, and said when she puts weight on it, it hurts and swells up. Said she went back to work and was told to go home, as her ankle is swollen and she can't put a sock on it. Please advise.  ?

## 2021-07-18 ENCOUNTER — Ambulatory Visit: Payer: Medicaid Other | Admitting: Orthopedic Surgery

## 2021-07-19 ENCOUNTER — Encounter (HOSPITAL_COMMUNITY): Payer: Self-pay

## 2021-07-19 ENCOUNTER — Emergency Department (HOSPITAL_COMMUNITY)
Admission: EM | Admit: 2021-07-19 | Discharge: 2021-07-19 | Disposition: A | Discharge status: Home / Self Care | Payer: Medicaid Other | Attending: Emergency Medicine | Admitting: Emergency Medicine

## 2021-07-19 ENCOUNTER — Other Ambulatory Visit: Payer: Self-pay

## 2021-07-19 DIAGNOSIS — Z5321 Procedure and treatment not carried out due to patient leaving prior to being seen by health care provider: Secondary | ICD-10-CM | POA: Diagnosis not present

## 2021-07-19 DIAGNOSIS — S93401D Sprain of unspecified ligament of right ankle, subsequent encounter: Secondary | ICD-10-CM

## 2021-07-19 DIAGNOSIS — M25571 Pain in right ankle and joints of right foot: Secondary | ICD-10-CM | POA: Insufficient documentation

## 2021-07-19 NOTE — ED Triage Notes (Signed)
Pt arrived via POV c/o right ankle pain. Pt followed up with [facility] following recent visit and was told by Dr Romeo Apple she no longer required the [employer] provided and would be able to bear weight on lower extremity and return to work. Pt reports though, she is unable to bear-weight on lower extremity and is unable to return to work due to swelling and pain.

## 2021-07-20 NOTE — ED Provider Notes (Signed)
Patient left without being seen   Ripley Fraise, MD 07/20/21 [phone]

## 2022-04-12 ENCOUNTER — Emergency Department (HOSPITAL_COMMUNITY)
Admission: EM | Admit: 2022-04-12 | Discharge: 2022-04-12 | Disposition: A | Discharge status: Home / Self Care | Payer: Medicaid Other | Attending: Emergency Medicine | Admitting: Emergency Medicine

## 2022-04-12 ENCOUNTER — Other Ambulatory Visit: Payer: Self-pay

## 2022-04-12 DIAGNOSIS — M67432 Ganglion, left wrist: Secondary | ICD-10-CM | POA: Insufficient documentation

## 2022-04-12 DIAGNOSIS — M71032 Abscess of bursa, left wrist: Secondary | ICD-10-CM | POA: Diagnosis not present

## 2022-04-12 MED ORDER — LIDOCAINE HCL (PF) 1 % IJ SOLN
5.0000 mL | Freq: Once | INTRAMUSCULAR | Status: AC
  Administered 2022-04-12: 5 mL via INTRADERMAL
  Filled 2022-04-12: qty 30

## 2022-04-12 MED ORDER — OXYCODONE HCL 5 MG PO TABS: 5.0000 mg | ORAL_TABLET | Freq: Four times a day (QID) | ORAL | 0 refills | Status: AC | PRN

## 2022-04-12 NOTE — Discharge Instructions (Signed)
Thank you for letting us take care of you today.  We drained the cyst on your wrist to help with pain relief and I sent a prescription of a small amount of pain medication to your pharmacy.  Please take this medication only as instructed.  Please follow-up with the hand surgeon provided to discuss long-term management of your ganglion cyst and possible removal.  If able to tolerate NSAIDs, I recommend taking ibuprofen 600 mg every 6-8 hours to help with symptoms.  If you develop fever, vomiting, worsening pain, or other concerns, please return to the nearest emergency department for reevaluation.

## 2022-04-12 NOTE — ED Triage Notes (Signed)
Pt reports abscess to left wrist that has gotten worse over the last few weeks. Pt reports she feels it is affecting the use of her thumb.

## 2022-04-12 NOTE — ED Provider Notes (Signed)
[facility] Provider Note   CSN: [id number] Arrival date & time: 04/12/22  2124     History  Chief Complaint  Patient presents with   Abscess    Veronica Mejia is a 27 y.o. female with no past medical history presents to the ED complaining of 2 cysts to the left wrist.  She states that 1 popped up approximately 2 weeks ago and the other smaller one popped up approximately 5 days ago is causing her a lot of pain.  She states that previously in 2021 she had ganglion cyst removed by Dr. Gerarda Fraction with orthopedic surgery.  She states that that cyst was much smaller she has not had any problems since that time. She denies fever, chills, nausea, vomiting, or other concerns today. She has tried Tylenol, Motrin, and Aleve at home without relief.   Home Medications No prescription medications  Allergies    Fentanyl, Hydrocodone, Hydrocodone-acetaminophen, Hydrocodone-acetaminophen, Prochlorperazine, Sulfa antibiotics, Sulfacetamide sodium, Sulfasalazine, and Sulfamethoxazole    Review of Systems   Review of Systems  All other systems reviewed and are negative.   Physical Exam Updated Vital Signs BP (!) 138/94 (BP Location: Right Arm)   Pulse (!) 111   Temp 98.1 F (36.7 C) (Oral)   Resp 18   Ht 5' 7"$  (1.702 m)   Wt 136.1 kg   LMP 03/03/2022 (Approximate)   SpO2 100%   BMI 46.99 kg/m  Physical Exam Vitals and nursing note reviewed.  Constitutional:      General: She is not in acute distress.    Appearance: Normal appearance. She is not toxic-appearing.  HENT:     Head: Normocephalic and atraumatic.     Mouth/Throat:     Mouth: Mucous membranes are moist.  Eyes:     Conjunctiva/sclera: Conjunctivae normal.  Cardiovascular:     Rate and Rhythm: Normal rate and regular rhythm.     Heart sounds: No murmur heard. Pulmonary:     Effort: Pulmonary effort is normal.     Breath sounds: Normal breath sounds.  Abdominal:     General:  Abdomen is flat.     Palpations: Abdomen is soft.     Tenderness: There is no abdominal tenderness.  Musculoskeletal:     Cervical back: Neck supple.     Right lower leg: No edema.     Left lower leg: No edema.     Comments: 2 circular cysts over volar aspect of left wrist, exquisitely tender, no drainage, no erythema, no streaking erythema 2+ radial pulse, normal sensation, range of motion of wrist and hand intact  Skin:    General: Skin is warm and dry.     Capillary Refill: Capillary refill takes less than 2 seconds.  Neurological:     General: No focal deficit present.     Mental Status: She is alert and oriented to person, place, and time.  Psychiatric:        Behavior: Behavior normal.     ED Results / Procedures / Treatments   Labs (all labs ordered are listed, but only abnormal results are displayed) Labs Reviewed - No data to display  EKG None  Radiology No results found.  Procedures .Marland KitchenIncision and Drainage  Date/Time: 04/12/2022 11:10 PM  Performed by: Suzzette Righter, PA-C Authorized by: Suzzette Righter, PA-C   Consent:    Consent obtained:  Verbal   Consent given by:  Patient   Risks discussed:  Bleeding, incomplete drainage,  pain and infection   Alternatives discussed:  No treatment, delayed treatment, alternative treatment, observation and referral Universal protocol:    Procedure explained and questions answered to patient or proxy's satisfaction: yes     Immediately prior to procedure, a time out was called: yes     Patient identity confirmed:  Verbally with patient Location:    Type:  Ganglion cyst   Location:  Upper extremity   Upper extremity location:  Wrist   Wrist location:  L wrist Pre-procedure details:    Skin preparation:  Povidone-iodine Anesthesia:    Anesthesia method:  Local infiltration   Local anesthetic:  Lidocaine 1% w/o epi Procedure type:    Complexity:  Simple Procedure details:    Ultrasound guidance: no     Needle  aspiration: yes     Needle size:  22 G   Incision types:  Single straight (multiple)   Wound management:  Extensive cleaning   Drainage characteristics: viscous, clear, nonbloody, consistent with synovial fluid.   Drainage amount:  Copious   Packing materials:  None Post-procedure details:    Procedure completion:  Tolerated well, no immediate complications     Medications Ordered in ED Medications  lidocaine (PF) (XYLOCAINE) 1 % injection 5 mL (5 mLs Intradermal Given 04/12/22 2258)    ED Course/ Medical Decision Making/ A&P                             Medical Decision Making Risk Prescription drug management.   Medical Decision Making:   [name] is a 27 y.o. female who presented to the ED today with skin complaint as detailed above.    External chart has been reviewed including prior orthopedic surgery charts from 2021. Complete initial physical exam performed, notably the patient was with exquisitely tender cysts over volar aspect of left wrist but neurovascularly intact and cysts did not appear acutely infected.  Reviewed and confirmed nursing documentation for past medical history, family history, social history.    Initial Assessment:   With the patient's presentation of cyst, most likely diagnosis is ganglion cyst. Other diagnoses were considered including (but not limited to) abscess, cellulitis, compartment syndrome, systemic infection. These are considered less likely due to history of present illness and physical exam findings.   This is most consistent with an acute complicated illness  Initial Plan:  Objective evaluation as reviewed  Cyst aspiration procedure Pain medication for home Hand surgery referral  Initial Study Results:    Final Assessment and Plan:   This is a 27 year old female presenting to the ED for evaluation of ganglion cyst to the left wrist.  She notes that she previously had a ganglion cyst removed in 2021 but it returned a couple  weeks ago and has been worsening since with increased pain and swelling.  On exam, patient has 2 round, tense cyst to the volar aspect of the left wrist that are not erythematous and nondraining.  They do not appear acutely infected but are exquisitely tender to palpation. Attending MD evaluated pt and agrees exam consistent with ganglion cyst. Aspiration procedure initiated with copious drainage of synovial fluid and subsequently pain improvement per patient. Pt tolerated procedure well. Discussed discharge plan including hand surgery referral, short course of pain medication for home as needed, strict ED return precautions. Pt expressed understanding of treatment plan, gratitude for procedure today, and stable for discharge.    Clinical Impression:  1. Ganglion cyst  of volar aspect of left wrist      Discharge           Final Clinical Impression(s) / ED Diagnoses Final diagnoses:  Ganglion cyst of volar aspect of left wrist    Rx / DC Orders ED Discharge Orders          Ordered    oxyCODONE (ROXICODONE) 5 MG immediate release tablet  Every 6 hours PRN        04/12/22 2334              Suzzette Righter, PA-C 04/12/22 2344    Dorie Rank, MD 04/13/22 [phone]

## 2022-05-01 ENCOUNTER — Encounter: Payer: Self-pay | Admitting: Radiology

## 2022-05-05 ENCOUNTER — Encounter: Payer: Self-pay | Admitting: General Practice

## 2022-05-06 ENCOUNTER — Encounter (HOSPITAL_COMMUNITY): Payer: Self-pay

## 2022-05-06 ENCOUNTER — Emergency Department (HOSPITAL_COMMUNITY)
Admission: EM | Admit: 2022-05-06 | Discharge: 2022-05-07 | Disposition: A | Discharge status: Home / Self Care | Payer: Medicaid Other | Attending: Emergency Medicine | Admitting: Emergency Medicine

## 2022-05-06 DIAGNOSIS — R000 Tachycardia, unspecified: Secondary | ICD-10-CM | POA: Diagnosis not present

## 2022-05-06 DIAGNOSIS — E860 Dehydration: Secondary | ICD-10-CM | POA: Insufficient documentation

## 2022-05-06 DIAGNOSIS — O26899 Other specified pregnancy related conditions, unspecified trimester: Secondary | ICD-10-CM | POA: Insufficient documentation

## 2022-05-06 DIAGNOSIS — Z3A00 Weeks of gestation of pregnancy not specified: Secondary | ICD-10-CM | POA: Insufficient documentation

## 2022-05-06 DIAGNOSIS — O219 Vomiting of pregnancy, unspecified: Secondary | ICD-10-CM | POA: Diagnosis present

## 2022-05-06 LAB — I-STAT BETA HCG BLOOD, ED (MC, WL, AP ONLY): I-stat hCG, quantitative: 950.7 m[IU]/mL — ABNORMAL HIGH (ref <5)

## 2022-05-06 LAB — COMPREHENSIVE METABOLIC PANEL
ALT: 27 U/L (ref 0–44)
AST: 23 U/L (ref 15–41)
Albumin: 3.8 g/dL (ref 3.5–5.0)
Alkaline Phosphatase: 62 U/L (ref 38–126)
Anion gap: 6 (ref 5–15)
BUN: 12 mg/dL (ref 6–20)
CO2: 25 mmol/L (ref 22–32)
Calcium: 9.1 mg/dL (ref 8.9–10.3)
Chloride: 105 mmol/L (ref 98–111)
Creatinine, Ser: 1.4 mg/dL — ABNORMAL HIGH (ref 0.44–1.00)
GFR, Estimated: 53 mL/min — ABNORMAL LOW (ref >60)
Glucose, Bld: 113 mg/dL — ABNORMAL HIGH (ref 70–99)
Potassium: 4 mmol/L (ref 3.5–5.1)
Sodium: 136 mmol/L (ref 135–145)
Total Bilirubin: 0.6 mg/dL (ref 0.3–1.2)
Total Protein: 7.3 g/dL (ref 6.5–8.1)

## 2022-05-06 LAB — URINALYSIS, ROUTINE W REFLEX MICROSCOPIC
Bilirubin Urine: NEGATIVE
Glucose, UA: NEGATIVE mg/dL
Hgb urine dipstick: NEGATIVE
Ketones, ur: NEGATIVE mg/dL
Leukocytes,Ua: NEGATIVE
Nitrite: NEGATIVE
Protein, ur: NEGATIVE mg/dL
Specific Gravity, Urine: 1.025 (ref 1.005–1.030)
pH: 5 (ref 5.0–8.0)

## 2022-05-06 LAB — LIPASE, BLOOD: Lipase: 52 U/L — ABNORMAL HIGH (ref 11–51)

## 2022-05-06 LAB — CBC
HCT: 40.7 % (ref 36.0–46.0)
Hemoglobin: 13.7 g/dL (ref 12.0–15.0)
MCH: 30.8 pg (ref 26.0–34.0)
MCHC: 33.7 g/dL (ref 30.0–36.0)
MCV: 91.5 fL (ref 80.0–100.0)
Platelets: 260 10*3/uL (ref 150–400)
RBC: 4.45 MIL/uL (ref 3.87–5.11)
RDW: 13.2 % (ref 11.5–15.5)
WBC: 7.6 10*3/uL (ref 4.0–10.5)
nRBC: 0 % (ref 0.0–0.2)

## 2022-05-06 LAB — ABO/RH: ABO/RH(D): B POS

## 2022-05-06 LAB — HCG, QUANTITATIVE, PREGNANCY: hCG, Beta Chain, Quant, S: 1244 m[IU]/mL — ABNORMAL HIGH (ref <5)

## 2022-05-06 MED ORDER — ONDANSETRON HCL 4 MG/2ML IJ SOLN
4.0000 mg | Freq: Once | INTRAMUSCULAR | Status: AC
  Administered 2022-05-06: 4 mg via INTRAVENOUS
  Filled 2022-05-06: qty 2

## 2022-05-06 MED ORDER — SODIUM CHLORIDE 0.9 % IV BOLUS
1000.0000 mL | Freq: Once | INTRAVENOUS | Status: AC
  Administered 2022-05-06: 1000 mL via INTRAVENOUS

## 2022-05-06 MED ORDER — DOXYLAMINE-PYRIDOXINE 10-10 MG PO TBEC: 1.0000 | DELAYED_RELEASE_TABLET | Freq: Every day | ORAL | 0 refills | Status: DC

## 2022-05-06 NOTE — ED Provider Notes (Signed)
[facility] Provider Note   CSN: [id number] Arrival date & time: 05/06/22  2153     History {Add pertinent medical, surgical, social history, OB history to HPI:1} Chief Complaint  Patient presents with   Emesis During Pregnancy    Veronica Mejia is a 27 y.o. female.  She has no significant past medical history.  She is a G3, P1 whose last menstrual period was January 3.  She had a positive home pregnancy test.  She is complaining of 1 day of nausea and vomiting with some upper abdominal cramps.  She feels very thirsty.  She has not seen an OB yet for this pregnancy.  No vaginal pain or discharge no urinary symptoms no fevers or chills.  The history is provided by the patient.  Emesis Severity:  Moderate Duration:  1 day Timing:  Intermittent Progression:  Unchanged Chronicity:  New Ineffective treatments:  None tried Associated symptoms: abdominal pain   Associated symptoms: no chills, no cough, no diarrhea and no fever   Risk factors: pregnant        Home Medications Prior to Admission medications   Medication Sig Start Date End Date Taking? Authorizing Provider  albuterol (PROVENTIL HFA;VENTOLIN HFA) 108 (90 Base) MCG/ACT inhaler Inhale 2 puffs into the lungs every 4 (four) hours as needed for wheezing or shortness of breath (cough). 03/14/16   Street, Paris, PA-C  Doxylamine-Pyridoxine 10-10 MG TBEC Take 1 tablet by mouth at bedtime. 06/14/20   Meccariello, Bernita Raisin, MD  ibuprofen (ADVIL) 800 MG tablet Take 1 tablet (800 mg total) by mouth every 8 (eight) hours as needed. 07/04/21   Carole Civil, MD  metoCLOPramide (REGLAN) 10 MG tablet Take 1 tablet (10 mg total) by mouth every 8 (eight) hours as needed for nausea. 07/27/18   Davonna Belling, MD  mupirocin ointment (BACTROBAN) 2 % Apply 1 application topically 2 (two) times daily. 04/26/21   Volney American, PA-C  naproxen (NAPROSYN) 500 MG tablet Take 1 tablet  (500 mg total) by mouth 2 (two) times daily. 0000000   Prince Rome, PA-C      Allergies    Fentanyl, Hydrocodone, Hydrocodone-acetaminophen, Hydrocodone-acetaminophen, Prochlorperazine, Sulfa antibiotics, Sulfacetamide sodium, Sulfasalazine, and Sulfamethoxazole    Review of Systems   Review of Systems  Constitutional:  Negative for chills and fever.  Respiratory:  Negative for cough.   Cardiovascular:  Negative for chest pain.  Gastrointestinal:  Positive for abdominal pain, nausea and vomiting. Negative for diarrhea.  Genitourinary:  Negative for vaginal bleeding and vaginal discharge.    Physical Exam Updated Vital Signs BP (!) 145/103 (BP Location: Left Arm) Comment (BP Location): forearm  Pulse (!) 125   Temp 98.2 F (36.8 C) (Oral)   Resp 18   LMP 03/03/2022 (Approximate)   SpO2 100%  Physical Exam Vitals and nursing note reviewed.  Constitutional:      General: She is not in acute distress.    Appearance: Normal appearance. She is well-developed.  HENT:     Head: Normocephalic and atraumatic.  Eyes:     Conjunctiva/sclera: Conjunctivae normal.  Cardiovascular:     Rate and Rhythm: Regular rhythm. Tachycardia present.     Heart sounds: No murmur heard. Pulmonary:     Effort: Pulmonary effort is normal. No respiratory distress.     Breath sounds: Normal breath sounds.  Abdominal:     Palpations: Abdomen is soft.     Tenderness: There is no abdominal tenderness.  There is no guarding or rebound.  Musculoskeletal:        General: No swelling.     Cervical back: Neck supple.  Skin:    General: Skin is warm and dry.     Capillary Refill: Capillary refill takes less than 2 seconds.  Neurological:     General: No focal deficit present.     Mental Status: She is alert.     Gait: Gait normal.     ED Results / Procedures / Treatments   Labs (all labs ordered are listed, but only abnormal results are displayed) Labs Reviewed  LIPASE, BLOOD - Abnormal;  Notable for the following components:      Result Value   Lipase 52 (*)    All other components within normal limits  COMPREHENSIVE METABOLIC PANEL - Abnormal; Notable for the following components:   Glucose, Bld 113 (*)    Creatinine, Ser 1.40 (*)    GFR, Estimated 53 (*)    All other components within normal limits  I-STAT BETA HCG BLOOD, ED ([facility], [facility], [facility] ONLY) - Abnormal; Notable for the following components:   I-stat hCG, quantitative 950.7 (*)    All other components within normal limits  CBC  URINALYSIS, ROUTINE W REFLEX MICROSCOPIC  HCG, QUANTITATIVE, PREGNANCY  ABO/RH    EKG None  Radiology No results found.  Procedures Procedures  {Document cardiac monitor, telemetry assessment procedure when appropriate:1}  Medications Ordered in ED Medications  sodium chloride 0.9 % bolus 1,000 mL (has no administration in time range)  ondansetron (ZOFRAN) injection 4 mg (has no administration in time range)    ED Course/ Medical Decision Making/ A&P   {   Click here for ABCD2, HEART and other calculatorsREFRESH Note before signing :1}                          Medical Decision Making Amount and/or Complexity of Data Reviewed Labs: ordered.  Risk Prescription drug management.   This patient complains of ***; this involves an extensive number of treatment Options and is a complaint that carries with it a high risk of complications and morbidity. The differential includes ***  I ordered, reviewed and interpreted labs, which included *** I ordered medication *** and reviewed PMP when indicated. I ordered imaging studies which included *** and I independently    visualized and interpreted imaging which showed *** Additional history obtained from *** Previous records obtained and reviewed *** I consulted *** and discussed lab and imaging findings and discussed disposition.  Cardiac monitoring reviewed, *** Social determinants considered, *** Critical Interventions:  ***  After the interventions stated above, I reevaluated the patient and found *** Admission and further testing considered, ***   {Document critical care time when appropriate:1} {Document review of labs and clinical decision tools ie heart score, Chads2Vasc2 etc:1}  {Document your independent review of radiology images, and any outside records:1} {Document your discussion with family members, caretakers, and with consultants:1} {Document social determinants of health affecting pt's care:1} {Document your decision making why or why not admission, treatments were needed:1} Final Clinical Impression(s) / ED Diagnoses Final diagnoses:  None    Rx / DC Orders ED Discharge Orders     None

## 2022-05-06 NOTE — ED Triage Notes (Signed)
Pt states that she took a pregnancy test that was + and now is having vomiting today with abd cramping.

## 2022-05-06 NOTE — Discharge Instructions (Signed)
You were seen in the emergency department for nausea and vomiting.  Your pregnancy test was positive.  Your lab work showed you to be dehydrated.  We are starting you on some nausea medication.  Please schedule appointment with OB.  Return to the emergency department if any worsening or concerning symptoms.

## 2022-05-06 NOTE — ED Notes (Signed)
Sent urine specimen with culture

## 2022-05-14 ENCOUNTER — Encounter (HOSPITAL_COMMUNITY): Payer: Self-pay

## 2022-05-14 ENCOUNTER — Emergency Department (HOSPITAL_COMMUNITY)
Admission: EM | Admit: 2022-05-14 | Discharge: 2022-05-15 | Discharge status: Against Medical Advice | Payer: Medicaid Other | Attending: Emergency Medicine | Admitting: Emergency Medicine

## 2022-05-14 ENCOUNTER — Other Ambulatory Visit: Payer: Self-pay

## 2022-05-14 DIAGNOSIS — R1030 Lower abdominal pain, unspecified: Secondary | ICD-10-CM | POA: Diagnosis not present

## 2022-05-14 DIAGNOSIS — Z5321 Procedure and treatment not carried out due to patient leaving prior to being seen by health care provider: Secondary | ICD-10-CM | POA: Insufficient documentation

## 2022-05-14 DIAGNOSIS — O26891 Other specified pregnancy related conditions, first trimester: Secondary | ICD-10-CM | POA: Insufficient documentation

## 2022-05-14 DIAGNOSIS — O219 Vomiting of pregnancy, unspecified: Secondary | ICD-10-CM | POA: Insufficient documentation

## 2022-05-14 DIAGNOSIS — Z3A01 Less than 8 weeks gestation of pregnancy: Secondary | ICD-10-CM | POA: Insufficient documentation

## 2022-05-14 LAB — CBC WITH DIFFERENTIAL/PLATELET
Abs Immature Granulocytes: 0.01 10*3/uL (ref 0.00–0.07)
Basophils Absolute: 0 10*3/uL (ref 0.0–0.1)
Basophils Relative: 1 %
Eosinophils Absolute: 0.1 10*3/uL (ref 0.0–0.5)
Eosinophils Relative: 1 %
HCT: 43.8 % (ref 36.0–46.0)
Hemoglobin: 14.8 g/dL (ref 12.0–15.0)
Immature Granulocytes: 0 %
Lymphocytes Relative: 23 %
Lymphs Abs: 1.4 10*3/uL (ref 0.7–4.0)
MCH: 30.8 pg (ref 26.0–34.0)
MCHC: 33.8 g/dL (ref 30.0–36.0)
MCV: 91.3 fL (ref 80.0–100.0)
Monocytes Absolute: 0.4 10*3/uL (ref 0.1–1.0)
Monocytes Relative: 6 %
Neutro Abs: 4.4 10*3/uL (ref 1.7–7.7)
Neutrophils Relative %: 69 %
Platelets: 215 10*3/uL (ref 150–400)
RBC: 4.8 MIL/uL (ref 3.87–5.11)
RDW: 13.3 % (ref 11.5–15.5)
WBC: 6.3 10*3/uL (ref 4.0–10.5)
nRBC: 0 % (ref 0.0–0.2)

## 2022-05-14 LAB — COMPREHENSIVE METABOLIC PANEL
ALT: 32 U/L (ref 0–44)
AST: 28 U/L (ref 15–41)
Albumin: 4.3 g/dL (ref 3.5–5.0)
Alkaline Phosphatase: 57 U/L (ref 38–126)
Anion gap: 7 (ref 5–15)
BUN: 10 mg/dL (ref 6–20)
CO2: 25 mmol/L (ref 22–32)
Calcium: 9.2 mg/dL (ref 8.9–10.3)
Chloride: 104 mmol/L (ref 98–111)
Creatinine, Ser: 0.94 mg/dL (ref 0.44–1.00)
GFR, Estimated: >60 mL/min (ref >60)
Glucose, Bld: 95 mg/dL (ref 70–99)
Potassium: 4 mmol/L (ref 3.5–5.1)
Sodium: 136 mmol/L (ref 135–145)
Total Bilirubin: 0.7 mg/dL (ref 0.3–1.2)
Total Protein: 7.5 g/dL (ref 6.5–8.1)

## 2022-05-14 LAB — LIPASE, BLOOD: Lipase: 47 U/L (ref 11–51)

## 2022-05-14 LAB — HCG, QUANTITATIVE, PREGNANCY: hCG, Beta Chain, Quant, S: 23990 m[IU]/mL — ABNORMAL HIGH (ref <5)

## 2022-05-14 MED ORDER — ONDANSETRON 4 MG PO TBDP: 4.0000 mg | ORAL_TABLET | Freq: Once | ORAL | Status: DC

## 2022-05-14 NOTE — ED Triage Notes (Signed)
~  [redacted] weeks pregnant.   Vomiting all day today. Called OB ([facility] [place]) encouraged to ED for fluids if unable to control at home.

## 2022-05-14 NOTE — ED Provider Triage Note (Signed)
Emergency Medicine Provider Triage Evaluation Note  Veronica Mejia , a 27 y.o. female  was evaluated in triage.  Pt complains of emesis.  Patient notes that she is approximately [redacted] weeks pregnant.  Her next appointment with her OB/GYN is in 2 weeks.  Has associated lower abdominal cramping.  Tried antiemetic at home without relief of her symptoms.  Also tried consuming Gatorade and water without relief.  Called the [facility] triage line who instructed her to come into the ED for IV fluids.  Denies urinary symptoms, rush of vaginal fluids, vaginal bleeding, need to push..  Review of Systems  Positive:  Negative:   Physical Exam  BP (!) 165/76   Pulse (!) 103   Temp 97.7 F (36.5 C) (Oral)   Resp 18   Ht '5\' 7"'$  (1.702 m)   Wt 135.2 kg   LMP 03/03/2022 (Approximate)   SpO2 99%   BMI 46.67 kg/m  Gen:   Awake, no distress   Resp:  Normal effort  MSK:   Moves extremities without difficulty  Other:  No abdominal TTP.   Medical Decision Making  Medically screening exam initiated at 8:58 PM.  Appropriate orders placed.  Graceyn Loza was informed that the remainder of the evaluation will be completed by another provider, this initial triage assessment does not replace that evaluation, and the importance of remaining in the ED until their evaluation is complete.  Workup initiated   Lavan Imes A, PA-C 05/14/22 2059

## 2022-09-20 ENCOUNTER — Inpatient Hospital Stay (HOSPITAL_COMMUNITY)
Admission: AD | Admit: 2022-09-20 | Discharge: 2022-09-21 | Disposition: A | Discharge status: Home / Self Care | Payer: Medicaid Other | Attending: Obstetrics and Gynecology | Admitting: Obstetrics and Gynecology

## 2022-09-20 ENCOUNTER — Encounter (HOSPITAL_COMMUNITY): Payer: Self-pay | Admitting: Obstetrics and Gynecology

## 2022-09-20 DIAGNOSIS — Z3A24 24 weeks gestation of pregnancy: Secondary | ICD-10-CM | POA: Insufficient documentation

## 2022-09-20 DIAGNOSIS — O26892 Other specified pregnancy related conditions, second trimester: Secondary | ICD-10-CM | POA: Diagnosis not present

## 2022-09-20 DIAGNOSIS — N898 Other specified noninflammatory disorders of vagina: Secondary | ICD-10-CM | POA: Diagnosis not present

## 2022-09-20 DIAGNOSIS — O368120 Decreased fetal movements, second trimester, not applicable or unspecified: Secondary | ICD-10-CM | POA: Insufficient documentation

## 2022-09-21 ENCOUNTER — Encounter (HOSPITAL_COMMUNITY): Payer: Self-pay | Admitting: Obstetrics and Gynecology

## 2022-09-21 ENCOUNTER — Other Ambulatory Visit: Payer: Self-pay

## 2022-09-21 DIAGNOSIS — N898 Other specified noninflammatory disorders of vagina: Secondary | ICD-10-CM

## 2022-09-21 DIAGNOSIS — Z3A24 24 weeks gestation of pregnancy: Secondary | ICD-10-CM

## 2022-09-21 DIAGNOSIS — O26892 Other specified pregnancy related conditions, second trimester: Secondary | ICD-10-CM

## 2022-09-21 LAB — WET PREP, GENITAL
Clue Cells Wet Prep HPF POC: NONE SEEN
Sperm: NONE SEEN
Trich, Wet Prep: NONE SEEN
WBC, Wet Prep HPF POC: <10 (ref <10)
Yeast Wet Prep HPF POC: NONE SEEN

## 2022-09-21 LAB — URINALYSIS, ROUTINE W REFLEX MICROSCOPIC
Bilirubin Urine: NEGATIVE
Glucose, UA: NEGATIVE mg/dL
Hgb urine dipstick: NEGATIVE
Ketones, ur: NEGATIVE mg/dL
Leukocytes,Ua: NEGATIVE
Nitrite: NEGATIVE
Protein, ur: NEGATIVE mg/dL
Specific Gravity, Urine: 1.01 (ref 1.005–1.030)
pH: 6 (ref 5.0–8.0)

## 2022-09-21 NOTE — Discharge Instructions (Signed)

## 2022-09-21 NOTE — MAU Note (Signed)
.  Veronica Mejia is a 27 y.o. at [redacted]w[redacted]d here in MAU reporting no FM for 2 days. Contractoins and mucous d/c with bloody streaks in it since this am. No recent intercourse. Has OB care at [facility] in [place] but wants to change because she does not like the care she receives there.   Onset of complaint: 2 days Pain score: 7 Vitals:   09/20/22 2354 09/20/22 2355  BP:  138/84  Pulse: 99   Resp: 18   Temp: 98 F (36.7 C)   SpO2: 100%      FHT:122 Lab orders placed from triage:  u/a

## 2022-09-22 LAB — GC/CHLAMYDIA PROBE AMP (~~LOC~~) NOT AT ARMC
Chlamydia: NEGATIVE
Comment: NEGATIVE
Comment: NORMAL
Neisseria Gonorrhea: NEGATIVE
